# Patient Record
Sex: Female | Born: 1985
Health system: Southern US, Community
[De-identification: ages and names within clinical notes are randomized; demographics above are authoritative.]

## PROBLEM LIST (undated history)

## (undated) DIAGNOSIS — F39 Unspecified mood [affective] disorder: Secondary | ICD-10-CM

## (undated) DIAGNOSIS — M1990 Unspecified osteoarthritis, unspecified site: Secondary | ICD-10-CM

## (undated) DIAGNOSIS — K219 Gastro-esophageal reflux disease without esophagitis: Secondary | ICD-10-CM

## (undated) DIAGNOSIS — F32A Depression, unspecified: Secondary | ICD-10-CM

## (undated) DIAGNOSIS — N809 Endometriosis, unspecified: Secondary | ICD-10-CM

## (undated) DIAGNOSIS — F329 Major depressive disorder, single episode, unspecified: Secondary | ICD-10-CM

## (undated) DIAGNOSIS — T7840XA Allergy, unspecified, initial encounter: Secondary | ICD-10-CM

## (undated) DIAGNOSIS — Z8759 Personal history of other complications of pregnancy, childbirth and the puerperium: Secondary | ICD-10-CM

## (undated) DIAGNOSIS — R102 Pelvic and perineal pain: Secondary | ICD-10-CM

## (undated) DIAGNOSIS — Z8742 Personal history of other diseases of the female genital tract: Secondary | ICD-10-CM

## (undated) DIAGNOSIS — N946 Dysmenorrhea, unspecified: Secondary | ICD-10-CM

## (undated) DIAGNOSIS — Z62810 Personal history of physical and sexual abuse in childhood: Secondary | ICD-10-CM

## (undated) DIAGNOSIS — G4700 Insomnia, unspecified: Secondary | ICD-10-CM

## (undated) DIAGNOSIS — D649 Anemia, unspecified: Secondary | ICD-10-CM

## (undated) DIAGNOSIS — F419 Anxiety disorder, unspecified: Secondary | ICD-10-CM

## (undated) DIAGNOSIS — M2142 Flat foot [pes planus] (acquired), left foot: Secondary | ICD-10-CM

## (undated) DIAGNOSIS — M2141 Flat foot [pes planus] (acquired), right foot: Secondary | ICD-10-CM

## (undated) HISTORY — DX: Endometriosis, unspecified: N80.9

## (undated) HISTORY — DX: Flat foot (pes planus) (acquired), right foot: M21.41

## (undated) HISTORY — DX: Allergy, unspecified, initial encounter: T78.40XA

## (undated) HISTORY — DX: Personal history of other complications of pregnancy, childbirth and the puerperium: Z87.59

## (undated) HISTORY — DX: Dysmenorrhea, unspecified: N94.6

## (undated) HISTORY — DX: Unspecified osteoarthritis, unspecified site: M19.90

## (undated) HISTORY — PX: MOUTH SURGERY: SHX715

## (undated) HISTORY — DX: Insomnia, unspecified: G47.00

## (undated) HISTORY — DX: Personal history of other diseases of the female genital tract: Z87.42

## (undated) HISTORY — DX: Unspecified mood (affective) disorder: F39

## (undated) HISTORY — DX: Personal history of physical and sexual abuse in childhood: Z62.810

## (undated) HISTORY — DX: Flat foot (pes planus) (acquired), right foot: M21.42

## (undated) HISTORY — DX: Pelvic and perineal pain: R10.2

## (undated) HISTORY — PX: ABDOMINAL HYSTERECTOMY: SHX81

---
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
## 2006-10-14 ENCOUNTER — Emergency Department: Payer: Self-pay | Admitting: Emergency Medicine

## 2006-10-14 ENCOUNTER — Other Ambulatory Visit: Payer: Self-pay

## 2007-09-26 ENCOUNTER — Emergency Department: Payer: Self-pay | Admitting: Emergency Medicine

## 2008-12-04 ENCOUNTER — Emergency Department: Payer: Self-pay | Admitting: Emergency Medicine

## 2011-07-14 ENCOUNTER — Emergency Department: Payer: Self-pay | Admitting: Emergency Medicine

## 2011-09-24 ENCOUNTER — Emergency Department: Payer: Self-pay | Admitting: Internal Medicine

## 2011-09-24 LAB — COMPREHENSIVE METABOLIC PANEL
Albumin: 3.9 g/dL (ref 3.4–5.0)
Alkaline Phosphatase: 36 U/L — ABNORMAL LOW (ref 50–136)
BUN: 9 mg/dL (ref 7–18)
Bilirubin,Total: 0.5 mg/dL (ref 0.2–1.0)
Calcium, Total: 9.6 mg/dL (ref 8.5–10.1)
Creatinine: 0.6 mg/dL (ref 0.60–1.30)
Glucose: 88 mg/dL (ref 65–99)
Osmolality: 268 (ref 275–301)
Potassium: 4.5 mmol/L (ref 3.5–5.1)
Sodium: 135 mmol/L — ABNORMAL LOW (ref 136–145)
Total Protein: 8.1 g/dL (ref 6.4–8.2)

## 2011-09-24 LAB — URINALYSIS, COMPLETE
Bilirubin,UR: NEGATIVE
Glucose,UR: NEGATIVE mg/dL (ref 0–75)
Hyaline Cast: 2
RBC,UR: 2 /HPF (ref 0–5)
Squamous Epithelial: 2
WBC UR: 3 /HPF (ref 0–5)

## 2011-09-24 LAB — CBC
HCT: 37.3 % (ref 35.0–47.0)
HGB: 12.3 g/dL (ref 12.0–16.0)
MCV: 84 fL (ref 80–100)
Platelet: 238 10*3/uL (ref 150–440)
RBC: 4.42 10*6/uL (ref 3.80–5.20)
WBC: 6.2 10*3/uL (ref 3.6–11.0)

## 2011-09-24 LAB — HCG, QUANTITATIVE, PREGNANCY: Beta Hcg, Quant.: 86827 m[IU]/mL — ABNORMAL HIGH

## 2012-02-11 ENCOUNTER — Observation Stay: Payer: Self-pay | Admitting: Obstetrics and Gynecology

## 2012-03-22 ENCOUNTER — Inpatient Hospital Stay: Payer: Self-pay

## 2012-03-22 LAB — CBC WITH DIFFERENTIAL/PLATELET
Eosinophil #: 0.1 10*3/uL (ref 0.0–0.7)
HCT: 33.6 % — ABNORMAL LOW (ref 35.0–47.0)
Lymphocyte #: 2.6 10*3/uL (ref 1.0–3.6)
Lymphocyte %: 21.1 %
MCHC: 32.4 g/dL (ref 32.0–36.0)
MCV: 80 fL (ref 80–100)
Monocyte #: 0.9 x10 3/mm (ref 0.2–0.9)
Monocyte %: 7.1 %
Neutrophil #: 8.8 10*3/uL — ABNORMAL HIGH (ref 1.4–6.5)
Platelet: 148 10*3/uL — ABNORMAL LOW (ref 150–440)
RDW: 13.8 % (ref 11.5–14.5)
WBC: 12.5 10*3/uL — ABNORMAL HIGH (ref 3.6–11.0)

## 2012-03-23 LAB — HEMATOCRIT: HCT: 28.8 % — ABNORMAL LOW (ref 35.0–47.0)

## 2013-03-23 ENCOUNTER — Emergency Department: Payer: Self-pay | Admitting: Emergency Medicine

## 2013-03-23 LAB — URINALYSIS, COMPLETE
Bilirubin,UR: NEGATIVE
Ketone: NEGATIVE
Leukocyte Esterase: NEGATIVE
Nitrite: NEGATIVE
Ph: 6 (ref 4.5–8.0)
Protein: NEGATIVE
Specific Gravity: 1.02 (ref 1.003–1.030)
Squamous Epithelial: NONE SEEN

## 2013-03-23 LAB — HCG, QUANTITATIVE, PREGNANCY: Beta Hcg, Quant.: <1 m[IU]/mL — ABNORMAL LOW

## 2013-03-23 LAB — CBC
HCT: 38.6 % (ref 35.0–47.0)
HGB: 12.8 g/dL (ref 12.0–16.0)
Platelet: 180 10*3/uL (ref 150–440)
RDW: 14.3 % (ref 11.5–14.5)
WBC: 5.4 10*3/uL (ref 3.6–11.0)

## 2013-04-14 ENCOUNTER — Emergency Department: Payer: Self-pay | Admitting: Emergency Medicine

## 2013-04-14 LAB — BASIC METABOLIC PANEL
Anion Gap: 6 — ABNORMAL LOW (ref 7–16)
BUN: 9 mg/dL (ref 7–18)
Calcium, Total: 9.3 mg/dL (ref 8.5–10.1)
EGFR (African American): >60
EGFR (Non-African Amer.): >60
Glucose: 87 mg/dL (ref 65–99)
Osmolality: 279 (ref 275–301)

## 2013-04-14 LAB — CBC
MCH: 27.3 pg (ref 26.0–34.0)
MCHC: 33.1 g/dL (ref 32.0–36.0)
MCV: 83 fL (ref 80–100)
Platelet: 200 10*3/uL (ref 150–440)

## 2013-04-14 LAB — URINALYSIS, COMPLETE
Bilirubin,UR: NEGATIVE
Ketone: NEGATIVE
Nitrite: NEGATIVE
Ph: 7 (ref 4.5–8.0)
Protein: NEGATIVE
RBC,UR: 13 /HPF (ref 0–5)
Specific Gravity: 1.019 (ref 1.003–1.030)
Squamous Epithelial: 2
WBC UR: 2 /HPF (ref 0–5)

## 2013-04-14 LAB — PREGNANCY, URINE: Pregnancy Test, Urine: NEGATIVE m[IU]/mL

## 2013-04-15 LAB — WET PREP, GENITAL

## 2013-04-15 LAB — GC/CHLAMYDIA PROBE AMP

## 2013-09-13 ENCOUNTER — Emergency Department: Payer: Self-pay | Admitting: Emergency Medicine

## 2013-09-13 LAB — CBC WITH DIFFERENTIAL/PLATELET
Basophil #: 0 10*3/uL (ref 0.0–0.1)
Basophil %: 1 %
EOS ABS: 0.1 10*3/uL (ref 0.0–0.7)
Eosinophil %: 2.8 %
HCT: 41.8 % (ref 35.0–47.0)
HGB: 13.7 g/dL (ref 12.0–16.0)
Lymphocyte #: 1.6 10*3/uL (ref 1.0–3.6)
Lymphocyte %: 54.2 %
MCH: 27.5 pg (ref 26.0–34.0)
MCHC: 32.7 g/dL (ref 32.0–36.0)
MCV: 84 fL (ref 80–100)
MONO ABS: 0.2 x10 3/mm (ref 0.2–0.9)
Monocyte %: 7.8 %
NEUTROS ABS: 1 10*3/uL — AB (ref 1.4–6.5)
Neutrophil %: 34.2 %
PLATELETS: 174 10*3/uL (ref 150–440)
RBC: 4.97 10*6/uL (ref 3.80–5.20)
RDW: 13.5 % (ref 11.5–14.5)
WBC: 2.9 10*3/uL — ABNORMAL LOW (ref 3.6–11.0)

## 2013-09-13 LAB — URINALYSIS, COMPLETE
BILIRUBIN, UR: NEGATIVE
BLOOD: NEGATIVE
Glucose,UR: NEGATIVE mg/dL (ref 0–75)
Ketone: NEGATIVE
Leukocyte Esterase: NEGATIVE
Nitrite: NEGATIVE
Ph: 6 (ref 4.5–8.0)
Protein: NEGATIVE
SPECIFIC GRAVITY: 1.026 (ref 1.003–1.030)
Squamous Epithelial: 3

## 2013-09-13 LAB — COMPREHENSIVE METABOLIC PANEL
ALBUMIN: 3.8 g/dL (ref 3.4–5.0)
Alkaline Phosphatase: 68 U/L
Anion Gap: 7 (ref 7–16)
BUN: 8 mg/dL (ref 7–18)
Bilirubin,Total: 0.4 mg/dL (ref 0.2–1.0)
CALCIUM: 8.8 mg/dL (ref 8.5–10.1)
Chloride: 107 mmol/L (ref 98–107)
Co2: 27 mmol/L (ref 21–32)
Creatinine: 0.76 mg/dL (ref 0.60–1.30)
EGFR (Non-African Amer.): >60
Glucose: 54 mg/dL — ABNORMAL LOW (ref 65–99)
Osmolality: 277 (ref 275–301)
POTASSIUM: 3.3 mmol/L — AB (ref 3.5–5.1)
SGOT(AST): 28 U/L (ref 15–37)
SGPT (ALT): 19 U/L (ref 12–78)
Sodium: 141 mmol/L (ref 136–145)
Total Protein: 7.9 g/dL (ref 6.4–8.2)

## 2014-07-21 ENCOUNTER — Emergency Department: Payer: Self-pay | Admitting: Emergency Medicine

## 2014-07-21 LAB — URINALYSIS, COMPLETE
BILIRUBIN, UR: NEGATIVE
Glucose,UR: NEGATIVE mg/dL (ref 0–75)
Ketone: NEGATIVE
Nitrite: NEGATIVE
PH: 5 (ref 4.5–8.0)
Protein: NEGATIVE
RBC,UR: 2 /HPF (ref 0–5)
Specific Gravity: 1.026 (ref 1.003–1.030)

## 2014-07-21 LAB — BASIC METABOLIC PANEL
ANION GAP: 7 (ref 7–16)
BUN: 7 mg/dL (ref 7–18)
CREATININE: 0.64 mg/dL (ref 0.60–1.30)
Calcium, Total: 8.8 mg/dL (ref 8.5–10.1)
Chloride: 107 mmol/L (ref 98–107)
Co2: 27 mmol/L (ref 21–32)
EGFR (African American): >60
EGFR (Non-African Amer.): >60
Glucose: 82 mg/dL (ref 65–99)
Osmolality: 278 (ref 275–301)
Potassium: 3.6 mmol/L (ref 3.5–5.1)
SODIUM: 141 mmol/L (ref 136–145)

## 2014-07-21 LAB — CBC
HCT: 37.9 % (ref 35.0–47.0)
HGB: 12 g/dL (ref 12.0–16.0)
MCH: 27.4 pg (ref 26.0–34.0)
MCHC: 31.8 g/dL — ABNORMAL LOW (ref 32.0–36.0)
MCV: 86 fL (ref 80–100)
Platelet: 203 10*3/uL (ref 150–440)
RBC: 4.4 10*6/uL (ref 3.80–5.20)
RDW: 12.9 % (ref 11.5–14.5)
WBC: 4.7 10*3/uL (ref 3.6–11.0)

## 2014-07-21 LAB — WET PREP, GENITAL

## 2014-07-21 LAB — GC/CHLAMYDIA PROBE AMP

## 2014-07-21 LAB — HCG, QUANTITATIVE, PREGNANCY

## 2014-07-21 LAB — D-DIMER(ARMC): D-Dimer: 151 ng/ml

## 2014-12-19 NOTE — H&P (Signed)
L&D Evaluation:  History Expanded:   HPI 29 yo 1 whose EDC = 04/09/12, patient [facility].  Pt has SROM at 6 am    Blood Type (Maternal) B positive    Group B Strep Results Maternal (Result >5wks must be treated as unknown) negative    Maternal HIV Negative    Maternal Syphilis Ab Nonreactive    Maternal Varicella Immune    Rubella Results (Maternal) immune    [facility]    Presents with leaking fluid    Patient's Medical History No Chronic Illness    Patient's Surgical History none    Medications Pre Natal Vitamins    Allergies NKDA    Social History none   ROS:   ROS All systems were reviewed.  HEENT, CNS, GI, GU, Respiratory, CV, Renal and Musculoskeletal systems were found to be normal.   Exam:   General no apparent distress    Mental Status clear    Chest clear    Heart normal sinus rhythm    Abdomen gravid, non-tender    Estimated Fetal Weight Average for gestational age    Back no CVAT    Pelvic 1-2, obvious SROM    Mebranes Ruptured    Description clear    FHT normal rate with no decels    Ucx irregular   Impression:   Impression early labor   Plan:   Comments Pt has been fully informed of the pros and cons, risk/benefits continued close observation versus the risks of induction. She understands that there are uncommon risks to induction, which include but are not limited to:  frequent and/or prolonged uterine contractions, fetal distress, uterine rupture and lack of success of induction.  She also has been informed that if the induction is not successful a Cesarean Section may be necessary.  All questions have been answered and she is in agreement with induction.   Electronic Signatures: Towana Badgerosenow, Pinkney Venard J (MD)  (Signed 12-Aug-13 10:04)  Authored: L&D Evaluation   Last Updated: 12-Aug-13 10:04 by Towana Badgerosenow, Bradley Handyside J (MD)

## 2015-01-17 ENCOUNTER — Ambulatory Visit
Admission: EM | Admit: 2015-01-17 | Discharge: 2015-01-17 | Disposition: A | Discharge status: Home / Self Care | Payer: 59 | Attending: Physician Assistant | Admitting: Physician Assistant

## 2015-01-17 DIAGNOSIS — J309 Allergic rhinitis, unspecified: Secondary | ICD-10-CM

## 2015-01-17 HISTORY — DX: Depression, unspecified: F32.A

## 2015-01-17 HISTORY — DX: Major depressive disorder, single episode, unspecified: F32.9

## 2015-01-17 HISTORY — DX: Anxiety disorder, unspecified: F41.9

## 2015-01-17 MED ORDER — FEXOFENADINE-PSEUDOEPHED ER 60-120 MG PO TB12: 1.0000 | ORAL_TABLET | Freq: Two times a day (BID) | ORAL | Status: DC

## 2015-01-17 MED ORDER — FLUTICASONE PROPIONATE 50 MCG/ACT NA SUSP: 1.0000 | Freq: Every day | NASAL | Status: DC

## 2015-01-17 NOTE — ED Provider Notes (Signed)
CSN: [id number]     Arrival date & time 01/17/15  1830 History   None    Chief Complaint  Patient presents with  . Allergic Rhinitis    (Consider location/radiation/quality/duration/timing/severity/associated sxs/prior Treatment) HPI  Patient presents with one week history of post-nasal drip, headache, and scab formation in the nasal mucous membranes. She denies any fever, chills, cough, or SOB. No sinus pressure or pain. She has tried taking OTC Mucinex and Sudafed with minimal relief. She also attempted to use a EchoStareti Pot, but was unable to tolerate it. She denies any history of seasonal allergies, but states she sneezes year-round and does not take anything for this.  Past Medical History  Diagnosis Date  . Depression   . Anxiety    History reviewed. No pertinent past surgical history. History reviewed. No pertinent family history. History  Substance Use Topics  . Smoking status: Never Smoker   . Smokeless tobacco: Not on file  . Alcohol Use: No   OB History    No data available     Review of Systems  Constitutional: Negative for fever and chills.  HENT: Positive for nosebleeds, postnasal drip and trouble swallowing. Negative for congestion, ear discharge, ear pain, sinus pressure and sore throat.   Respiratory: Negative for cough and shortness of breath.   Cardiovascular: Negative for chest pain.  Gastrointestinal: Negative for nausea and diarrhea.  Neurological: Positive for headaches.  Psychiatric/Behavioral: The patient is not nervous/anxious.     Allergies  Review of patient's allergies indicates no known allergies.  Home Medications   Prior to Admission medications   Not on File   LMP 01/11/2015 (Exact Date) Physical Exam  Constitutional: She is oriented to person, place, and time. She appears well-developed and well-nourished.  HENT:  Head: Normocephalic.  Right Ear: External ear normal.  Left Ear: External ear normal.  Nose: Nose normal.  Mouth/Throat:  Oropharynx is clear and moist.  Cardiovascular: Normal rate, regular rhythm and normal heart sounds.   No murmur heard. Pulmonary/Chest: Breath sounds normal. No respiratory distress.  Lymphadenopathy:    She has cervical adenopathy.  Neurological: She is alert and oriented to person, place, and time.  Skin: Skin is warm and dry.  Psychiatric: She has a normal mood and affect.    ED Course  Procedures (including critical care time) Labs Review Labs Reviewed - No data to display  Imaging Review No results found.   MDM   1. Allergic rhinitis, unspecified allergic rhinitis type    Patient placed on flonase and Allegra-D. If symptoms fail to improve, she will follow-up with Dr. Elenore RotaJuengel for further evaluation.    Carlean PurlNicole L Lane, PA-C 01/17/15 2018

## 2015-01-17 NOTE — Discharge Instructions (Signed)
Follow- up with Dr. Vernie MurdersPaul Juengel, ENT Specialist, if no improvement. [address] [place], [place] [id number] Location Phone: [phone]

## 2015-01-17 NOTE — ED Notes (Signed)
Pt states " I have sores in both nostrils and feel choked up in my throat. I am eating and drinking ok. When I lay down I have a constant post nasal drip. I usually have nose bleeds once a month, but now I am having them more frequently."

## 2015-05-15 ENCOUNTER — Ambulatory Visit: Payer: Self-pay | Admitting: Family Medicine

## 2015-05-24 ENCOUNTER — Encounter: Payer: Self-pay | Admitting: Family Medicine

## 2015-05-29 ENCOUNTER — Ambulatory Visit: Payer: Self-pay | Admitting: Family Medicine

## 2015-08-10 ENCOUNTER — Encounter: Payer: Self-pay | Admitting: Unknown Physician Specialty

## 2015-08-10 ENCOUNTER — Ambulatory Visit (INDEPENDENT_AMBULATORY_CARE_PROVIDER_SITE_OTHER): Payer: 59 | Admitting: Unknown Physician Specialty

## 2015-08-10 VITALS — BP 110/77 | HR 73 | Temp 98.2°F | Ht 68.0 in | Wt 120.6 lb

## 2015-08-10 DIAGNOSIS — J309 Allergic rhinitis, unspecified: Secondary | ICD-10-CM | POA: Diagnosis not present

## 2015-08-10 DIAGNOSIS — J0101 Acute recurrent maxillary sinusitis: Secondary | ICD-10-CM | POA: Diagnosis not present

## 2015-08-10 MED ORDER — AZITHROMYCIN 250 MG PO TABS: ORAL_TABLET | ORAL | Status: DC

## 2015-08-10 MED ORDER — MOMETASONE FUROATE 50 MCG/ACT NA SUSP: 2.0000 | Freq: Every day | NASAL | Status: DC

## 2015-08-10 NOTE — Progress Notes (Signed)
   BP 110/77 mmHg  Pulse 73  Temp(Src) 98.2 F (36.8 C)  Ht 5\' 8"  (1.727 m)  Wt 120 lb 9.6 oz (54.704 kg)  BMI 18.34 kg/m2  SpO2 95%  LMP 08/03/2015 (Exact Date)   Subjective:    Patient ID: Courtney Rivers, female    DOB: 04/26/1986, 29 y.o.   MRN: [id number]  HPI: Donnamarie RossettiChristan A Clinton SawyerWilliamson is a 29 y.o. female  Chief Complaint  Patient presents with  . Sinusitis    pt states she has had a sinus infection for around a month now   Sinusitis This is a new problem. The current episode started more than 1 month ago. The problem is unchanged. There has been no fever. Associated symptoms include congestion, ear pain, sinus pressure and a sore throat. Pertinent negatives include no coughing or diaphoresis. Past treatments include oral decongestants. The treatment provided no relief.   Allergic rhinitis Year round issue.  Takes the occasional Clar[place] which doesn't help.  Under the care of an allergist at one time.  She has had symptoms for years.  Flonase causes nose bleeds and all the hair to fall out in her nose.    Relevant past medical, surgical, family and social history reviewed and updated as indicated. Interim medical history since our last visit reviewed. Allergies and medications reviewed and updated.  Review of Systems  Constitutional: Negative for diaphoresis.  HENT: Positive for congestion, ear pain, sinus pressure and sore throat.   Respiratory: Negative for cough.     Per HPI unless specifically indicated above     Objective:    BP 110/77 mmHg  Pulse 73  Temp(Src) 98.2 F (36.8 C)  Ht 5\' 8"  (1.727 m)  Wt 120 lb 9.6 oz (54.704 kg)  BMI 18.34 kg/m2  SpO2 95%  LMP 08/03/2015 (Exact Date)  Wt Readings from Last 3 Encounters:  08/10/15 120 lb 9.6 oz (54.704 kg)    Physical Exam  Constitutional: She is oriented to person, place, and time. She appears well-developed and well-nourished. No distress.  HENT:  Head: Normocephalic and atraumatic.  Right Ear:  Tympanic membrane and ear canal normal.  Left Ear: Tympanic membrane and ear canal normal.  Nose: No rhinorrhea. Right sinus exhibits maxillary sinus tenderness. Right sinus exhibits no frontal sinus tenderness. Left sinus exhibits maxillary sinus tenderness. Left sinus exhibits no frontal sinus tenderness.  Eyes: Conjunctivae and lids are normal. Right eye exhibits no discharge. Left eye exhibits no discharge. No scleral icterus.  Cardiovascular: Normal rate and regular rhythm.   Pulmonary/Chest: Effort normal and breath sounds normal. No respiratory distress.  Abdominal: Normal appearance. There is no splenomegaly or hepatomegaly.  Musculoskeletal: Normal range of motion.  Neurological: She is alert and oriented to person, place, and time.  Skin: Skin is intact. No rash noted. No pallor.  Psychiatric: She has a normal mood and affect. Her behavior is normal. Judgment and thought content normal.     Assessment & Plan:   Problem List Items Addressed This Visit      Unprioritized   Rhinitis, allergic - Primary    Other Visit Diagnoses    Acute recurrent maxillary sinusitis        Relevant Medications    azithromycin (ZITHROMAX Z-PAK) 250 MG tablet    mometasone (NASONEX) 50 MCG/ACT nasal spray        Follow up plan: Return if symptoms worsen or fail to improve.

## 2015-09-11 ENCOUNTER — Encounter: Payer: Self-pay | Admitting: Family Medicine

## 2015-11-02 ENCOUNTER — Ambulatory Visit: Payer: 59 | Admitting: Family Medicine

## 2015-12-18 ENCOUNTER — Encounter: Payer: Self-pay | Admitting: Family Medicine

## 2015-12-18 ENCOUNTER — Ambulatory Visit (INDEPENDENT_AMBULATORY_CARE_PROVIDER_SITE_OTHER): Payer: 59 | Admitting: Family Medicine

## 2015-12-18 VITALS — BP 105/70 | HR 83 | Temp 99.0°F | Ht 67.7 in | Wt 121.0 lb

## 2015-12-18 DIAGNOSIS — J019 Acute sinusitis, unspecified: Secondary | ICD-10-CM

## 2015-12-18 MED ORDER — AZITHROMYCIN 250 MG PO TABS: ORAL_TABLET | ORAL | Status: DC

## 2015-12-18 NOTE — Progress Notes (Signed)
BP 105/70 mmHg  Pulse 83  Temp(Src) 99 F (37.2 C)  Ht 5' 7.7" (1.72 m)  Wt 121 lb (54.885 kg)  BMI 18.55 kg/m2  SpO2 100%  LMP 11/21/2015 (Exact Date)   Subjective:    Patient ID: Courtney Rivers, female    DOB: 10/13/1985, 30 y.o.   MRN: [id number]  HPI: Courtney Rivers is a 30 y.o. female  Chief Complaint  Patient presents with  . URI    started Sunday    Patient with marked sinus congestion and facial pain and pressure feeling bad has really been ongoing for 4 days now and getting worse. Has had marked systemic symptoms with chills aches feeling bad and tired. Has tried Mucinex  Relevant past medical, surgical, family and social history reviewed and updated as indicated. Interim medical history since our last visit reviewed. Allergies and medications reviewed and updated.  Review of Systems  Constitutional: Positive for fever, chills, diaphoresis and fatigue.  HENT: Positive for congestion, sinus pressure, sneezing and sore throat.   Respiratory: Positive for cough.   Cardiovascular: Negative.     Per HPI unless specifically indicated above     Objective:    BP 105/70 mmHg  Pulse 83  Temp(Src) 99 F (37.2 C)  Ht 5' 7.7" (1.72 m)  Wt 121 lb (54.885 kg)  BMI 18.55 kg/m2  SpO2 100%  LMP 11/21/2015 (Exact Date)  Wt Readings from Last 3 Encounters:  12/18/15 121 lb (54.885 kg)  08/10/15 120 lb 9.6 oz (54.704 kg)    Physical Exam  Constitutional: She is oriented to person, place, and time. She appears well-developed and well-nourished. No distress.  HENT:  Head: Normocephalic and atraumatic.  Right Ear: Hearing and external ear normal.  Left Ear: Hearing and external ear normal.  Nose: Nose normal.  Mouth/Throat: Oropharyngeal exudate present.  Eyes: Conjunctivae and lids are normal. Right eye exhibits no discharge. Left eye exhibits no discharge. No scleral icterus.  Cardiovascular: Normal rate, regular rhythm and normal heart sounds.    Pulmonary/Chest: Effort normal and breath sounds normal. No respiratory distress.  Musculoskeletal: Normal range of motion.  Lymphadenopathy:    She has no cervical adenopathy.  Neurological: She is alert and oriented to person, place, and time.  Skin: Skin is intact. No rash noted.  Psychiatric: She has a normal mood and affect. Her speech is normal and behavior is normal. Judgment and thought content normal. Cognition and memory are normal.    Results for orders placed or performed in visit on 07/21/14  GC/Chlamydia Probe Amp  Result Value Ref Range   Micro Text Report         SOURCE: ENDOCERVICAL SWAB    CHLAMYDIA                 CHLAMYDIA TRACHOMATIS NEGATIVE   N.GONORRHOEAE             N.GONORRHOEAE NEGATIVE   ANTIBIOTIC                                                      Wet prep, genital  Result Value Ref Range   Micro Text Report         COMMENT                   MODERATE WHITE BLOOD  CELLS SEEN   COMMENT                   NO TRICHOMONAS,SPERMATOZOA,YEAST,OR CLUE CELLS SEEN   ANTIBIOTIC                                                      CBC  Result Value Ref Range   WBC 4.7 3.6-11.0 x10 3/mm 3   RBC 4.40 3.80-5.20 X10 6/mm 3   HGB 12.0 12.0-16.0 g/dL   HCT 37.9 35.0-47.0 %   MCV 86 80-100 fL   MCH 27.4 26.0-34.0 pg   MCHC 31.8 (L) 32.0-36.0 g/dL   RDW 12.9 11.5-14.5 %   Platelet 203 150-440 x10 3/mm 3  Basic metabolic panel  Result Value Ref Range   Glucose 82 65-99 mg/dL   BUN 7 7-18 mg/dL   Creatinine 0.64 0.60-1.30 mg/dL   Sodium 141 136-145 mmol/L   Potassium 3.6 3.5-5.1 mmol/L   Chloride 107 98-107 mmol/L   Co2 27 21-32 mmol/L   Calcium, Total 8.8 8.5-10.1 mg/dL   Osmolality 278 275-301   Anion Gap 7 7-16   EGFR (African American) >60 >43m/min   EGFR (Non-African Amer.) >60 >614mmin  Urinalysis, Complete  Result Value Ref Range   Color - urine Yellow    Clarity - urine Hazy    Glucose,UR Negative 0-75 mg/dL   Bilirubin,UR Negative  NEGATIVE   Ketone Negative NEGATIVE   Specific Gravity 1.026 1.003-1.030   Blood 1+ NEGATIVE   Ph 5.0 4.5-8.0   Protein Negative NEGATIVE   Nitrite Negative NEGATIVE   Leukocyte Esterase Trace NEGATIVE   RBC,UR 2 /HPF 0-5 /HPF   WBC UR 7 /HPF 0-5 /HPF   Bacteria TRACE NONE SEEN   Squamous Epithelial 7 /HPF    Mucous PRESENT   [facility] Result Value Ref Range   D-Dimer 151 ng/ml  hCG, quantitative, pregnancy  Result Value Ref Range   Beta Hcg, Quant. < 1 (L) mIU/mL      Assessment & Plan:   Problem List Items Addressed This Visit    None    Visit Diagnoses    Acute sinusitis, recurrence not specified, unspecified location    -  Primary    Discussed sinusitis care and treatment use of over-the-counter medications Mucinex Tylenol etc. will go home for the next day or 2 proper cautions    Relevant Medications    azithromycin (ZITHROMAX) 250 MG tablet        Follow up plan: Return if symptoms worsen or fail to improve, for As scheduled.

## 2016-08-16 ENCOUNTER — Telehealth: Payer: 59 | Admitting: Nurse Practitioner

## 2016-08-16 DIAGNOSIS — J111 Influenza due to unidentified influenza virus with other respiratory manifestations: Secondary | ICD-10-CM

## 2016-08-16 MED ORDER — OSELTAMIVIR PHOSPHATE 75 MG PO CAPS: 75.0000 mg | ORAL_CAPSULE | Freq: Two times a day (BID) | ORAL | 0 refills | Status: DC

## 2016-08-16 NOTE — Progress Notes (Signed)

## 2016-08-17 ENCOUNTER — Encounter: Payer: Self-pay | Admitting: *Deleted

## 2016-08-17 ENCOUNTER — Ambulatory Visit
Admission: EM | Admit: 2016-08-17 | Discharge: 2016-08-17 | Disposition: A | Discharge status: Home / Self Care | Payer: 59 | Attending: Emergency Medicine | Admitting: Emergency Medicine

## 2016-08-17 DIAGNOSIS — J101 Influenza due to other identified influenza virus with other respiratory manifestations: Secondary | ICD-10-CM | POA: Diagnosis not present

## 2016-08-17 LAB — RAPID INFLUENZA A&B ANTIGENS
Influenza A (ARMC): POSITIVE — AB
Influenza B (ARMC): NEGATIVE

## 2016-08-17 LAB — RAPID STREP SCREEN (MED CTR MEBANE ONLY): Streptococcus, Group A Screen (Direct): NEGATIVE

## 2016-08-17 MED ORDER — ONDANSETRON 4 MG PO TBDP: 4.0000 mg | ORAL_TABLET | Freq: Three times a day (TID) | ORAL | 0 refills | Status: DC | PRN

## 2016-08-17 MED ORDER — IPRATROPIUM BROMIDE 0.06 % NA SOLN: 2.0000 | Freq: Four times a day (QID) | NASAL | 0 refills | Status: DC

## 2016-08-17 MED ORDER — IBUPROFEN 600 MG PO TABS: 600.0000 mg | ORAL_TABLET | Freq: Four times a day (QID) | ORAL | 0 refills | Status: DC | PRN

## 2016-08-17 NOTE — ED Triage Notes (Signed)
Patient started having symptoms of cough, fever, body aches, sore throat, nasal congestion and drainage yesterday. Symptoms have worsened over time.

## 2016-08-17 NOTE — Discharge Instructions (Signed)
600 mg ibuprofen with 1 g of Tylenol, Zofran, push electrolyte containing fluids, try the Atrovent and the saline nasal irrigation.
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
## 2016-08-20 LAB — CULTURE, GROUP A STREP (THRC)

## 2016-10-31 ENCOUNTER — Ambulatory Visit (INDEPENDENT_AMBULATORY_CARE_PROVIDER_SITE_OTHER): Payer: 59 | Admitting: Family Medicine

## 2016-10-31 ENCOUNTER — Encounter: Payer: Self-pay | Admitting: Family Medicine

## 2016-10-31 VITALS — BP 112/79 | HR 86 | Temp 98.4°F | Ht 69.0 in | Wt 120.0 lb

## 2016-10-31 DIAGNOSIS — F331 Major depressive disorder, recurrent, moderate: Secondary | ICD-10-CM | POA: Diagnosis not present

## 2016-10-31 DIAGNOSIS — Z309 Encounter for contraceptive management, unspecified: Secondary | ICD-10-CM

## 2016-10-31 MED ORDER — BUPROPION HCL ER (XL) 150 MG PO TB24: 150.0000 mg | ORAL_TABLET | Freq: Every day | ORAL | 1 refills | Status: DC
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
## 2016-11-03 DIAGNOSIS — F329 Major depressive disorder, single episode, unspecified: Secondary | ICD-10-CM | POA: Insufficient documentation

## 2016-11-03 DIAGNOSIS — F331 Major depressive disorder, recurrent, moderate: Secondary | ICD-10-CM | POA: Insufficient documentation

## 2016-11-03 NOTE — Assessment & Plan Note (Signed)
Will start back on wellbutrin and monitor very closely. Will add further medications slowly. Discussed working with a counselor as adjunct therapy, did not like her previous experience so will provide list of other options. Risks and benefits discussed, alerted her about suicidality warnings as well as possibility of increased agitation or anxiety.

## 2016-11-03 NOTE — Patient Instructions (Signed)
Follow up in 1 month for physical and medication management

## 2016-11-06 ENCOUNTER — Other Ambulatory Visit: Payer: Self-pay

## 2016-11-06 MED ORDER — ONDANSETRON 4 MG PO TBDP: 4.0000 mg | ORAL_TABLET | Freq: Three times a day (TID) | ORAL | 0 refills | Status: DC | PRN

## 2016-11-06 NOTE — Telephone Encounter (Signed)
Zofran sent. 

## 2016-11-06 NOTE — Telephone Encounter (Signed)
She is having some nausea with her new medication and wants to know if you can refill her zofran rx.

## 2016-11-18 ENCOUNTER — Telehealth: Payer: Self-pay

## 2016-11-18 MED ORDER — QUETIAPINE FUMARATE 25 MG PO TABS: 25.0000 mg | ORAL_TABLET | Freq: Every day | ORAL | 1 refills | Status: DC

## 2016-11-18 MED ORDER — SERTRALINE HCL 50 MG PO TABS: 50.0000 mg | ORAL_TABLET | Freq: Every day | ORAL | 1 refills | Status: DC

## 2016-11-18 MED ORDER — NORGESTIM-ETH ESTRAD TRIPHASIC 0.18/0.215/0.25 MG-35 MCG PO TABS: 1.0000 | ORAL_TABLET | Freq: Every day | ORAL | 11 refills | Status: DC

## 2016-11-18 NOTE — Telephone Encounter (Signed)
Patient has some concerns about her Wellbutrin and if it is working for her. She also wants to know if the Wellbutrin is for anxiety as well.   She wants to start a BCP.  She also wants to know if there is something she can take to help her sleep.

## 2016-11-18 NOTE — Telephone Encounter (Signed)
Wellbutrin not having any effect, pt wanting to switch. Was previously on seroquel and she believes zoloft and did well, wanting to try that again. Discussed with patient taper off wellbutrin take every other day x 1 week then start zoloft every morning and seroquel at night.  Will start ortho tri cyclen until she can get into GYN next month to discuss implantable BC methods to try and help regulate cycles/sxs better.

## 2016-11-19 ENCOUNTER — Other Ambulatory Visit: Payer: Self-pay | Admitting: Family Medicine

## 2016-11-19 MED ORDER — QUETIAPINE FUMARATE 25 MG PO TABS: 25.0000 mg | ORAL_TABLET | Freq: Every day | ORAL | 1 refills | Status: DC

## 2016-11-19 MED ORDER — SERTRALINE HCL 50 MG PO TABS: 50.0000 mg | ORAL_TABLET | Freq: Every day | ORAL | 1 refills | Status: DC

## 2016-11-20 ENCOUNTER — Other Ambulatory Visit: Payer: Self-pay | Admitting: Family Medicine

## 2016-11-20 MED ORDER — NORGESTIM-ETH ESTRAD TRIPHASIC 0.18/0.215/0.25 MG-35 MCG PO TABS: 1.0000 | ORAL_TABLET | Freq: Every day | ORAL | 11 refills | Status: DC

## 2016-11-26 ENCOUNTER — Telehealth: Payer: Self-pay

## 2016-11-26 NOTE — Telephone Encounter (Signed)
Error

## 2016-11-26 NOTE — Telephone Encounter (Deleted)
She is still struggling with her depression, PHQ 9 score has upped from 15 to 24.

## 2016-12-03 ENCOUNTER — Encounter: Payer: Self-pay | Admitting: Family Medicine

## 2016-12-03 ENCOUNTER — Ambulatory Visit (INDEPENDENT_AMBULATORY_CARE_PROVIDER_SITE_OTHER): Payer: 59 | Admitting: Family Medicine

## 2016-12-03 VITALS — BP 101/68 | HR 72 | Temp 98.5°F | Wt 118.0 lb

## 2016-12-03 DIAGNOSIS — J029 Acute pharyngitis, unspecified: Secondary | ICD-10-CM

## 2016-12-03 DIAGNOSIS — J069 Acute upper respiratory infection, unspecified: Secondary | ICD-10-CM

## 2016-12-03 DIAGNOSIS — R6889 Other general symptoms and signs: Secondary | ICD-10-CM | POA: Diagnosis not present

## 2016-12-03 LAB — VERITOR FLU A/B WAIVED
INFLUENZA B: NEGATIVE
Influenza A: NEGATIVE

## 2016-12-03 MED ORDER — MECLIZINE HCL 25 MG PO TABS: 25.0000 mg | ORAL_TABLET | Freq: Three times a day (TID) | ORAL | 0 refills | Status: DC | PRN

## 2016-12-03 MED ORDER — LIDOCAINE VISCOUS 2 % MT SOLN: 5.0000 mL | OROMUCOSAL | 0 refills | Status: DC | PRN

## 2016-12-03 MED ORDER — PSEUDOEPHEDRINE HCL 30 MG PO TABS: 30.0000 mg | ORAL_TABLET | ORAL | 0 refills | Status: DC | PRN

## 2016-12-03 NOTE — Progress Notes (Signed)
   BP 101/68 (BP Location: Left Arm, Patient Position: Sitting, Cuff Size: Normal)   Pulse 72   Temp 98.5 F (36.9 C)   Wt 118 lb (53.5 kg)   SpO2 100%   BMI 17.43 kg/m    Subjective:    Patient ID: Courtney Rivers, female    DOB: 10/15/1985, 31 y.o.   MRN: [id number]  HPI: Courtney Rivers is a 31 y.o. female  Chief Complaint  Patient presents with  . URI   Patient presents with 4-5 day history of congestion, sore throat, dizziness, ear pain, fatigue. The dizziness is nearly constant and feels like the room is spinning around her. Denies fever, chills, aches, N/V/D, cough, SOB. Has not tried anything for sxs other than regular allergy regimen of zyrtec tablets. No sick contacts at home, but was around 300 college students this weekend.   Relevant past medical, surgical, family and social history reviewed and updated as indicated. Interim medical history since our last visit reviewed. Allergies and medications reviewed and updated.  Review of Systems  Constitutional: Positive for appetite change and fatigue.  HENT: Positive for congestion, ear pain and sore throat. Negative for sinus pain.   Eyes: Negative.   Respiratory: Negative.   Cardiovascular: Negative.   Gastrointestinal: Negative.   Genitourinary: Negative.   Musculoskeletal: Negative.   Neurological: Negative.   Psychiatric/Behavioral: Negative.     Per HPI unless specifically indicated above     Objective:    BP 101/68 (BP Location: Left Arm, Patient Position: Sitting, Cuff Size: Normal)   Pulse 72   Temp 98.5 F (36.9 C)   Wt 118 lb (53.5 kg)   SpO2 100%   BMI 17.43 kg/m   Wt Readings from Last 3 Encounters:  12/03/16 118 lb (53.5 kg)  10/31/16 120 lb (54.4 kg)  08/17/16 120 lb (54.4 kg)    Physical Exam  Constitutional: She is oriented to person, place, and time. She appears well-developed and well-nourished.  HENT:  Head: Atraumatic.  Nose: Nose normal.  B/l TMs with significant  amount of cerumen present, difficult to visualize TMs Oropharynx non-erythematous but tonsils appear mildly cryptic  Eyes: Conjunctivae are normal. Pupils are equal, round, and reactive to light.  Neck: Normal range of motion. Neck supple.  Cardiovascular: Normal rate and normal heart sounds.   Pulmonary/Chest: Effort normal and breath sounds normal. No respiratory distress.  Musculoskeletal: Normal range of motion.  Neurological: She is alert and oriented to person, place, and time.  Skin: Skin is warm and dry.  Psychiatric: She has a normal mood and affect. Her behavior is normal.  Nursing note and vitals reviewed.     Assessment & Plan:   Problem List Items Addressed This Visit    None    Visit Diagnoses    Viral upper respiratory tract infection    -  Primary   Rapid strep neg, suspect viral illness. Will send meclizine for dizziness and viscous lidocaine for throat relief. Sudafed, zyrtec, humidifier   Relevant Orders   Influenza A & B (STAT) (Completed)   Sore throat       Await strep culture. Viscous lidocaine sent. F/u if worsening or no improvement   Relevant Orders   Rapid strep screen (not at [facility]) (Completed)       Follow up plan: Return if symptoms worsen or fail to improve.

## 2016-12-04 NOTE — Patient Instructions (Signed)
Follow up as needed

## 2016-12-05 ENCOUNTER — Other Ambulatory Visit: Payer: Self-pay | Admitting: Family Medicine

## 2016-12-05 ENCOUNTER — Telehealth: Payer: Self-pay

## 2016-12-05 ENCOUNTER — Encounter: Payer: Self-pay | Admitting: Family Medicine

## 2016-12-05 MED ORDER — DOXYCYCLINE HYCLATE 100 MG PO TABS: 100.0000 mg | ORAL_TABLET | Freq: Two times a day (BID) | ORAL | 0 refills | Status: DC

## 2016-12-05 MED ORDER — AZITHROMYCIN 250 MG PO TABS: ORAL_TABLET | ORAL | 0 refills | Status: DC

## 2016-12-05 NOTE — Telephone Encounter (Signed)
Pharmacy states there is a drug interaction between Seroquel and Azithromycin. Please advise.

## 2016-12-05 NOTE — Telephone Encounter (Signed)
Sent in doxycycline

## 2016-12-06 LAB — CULTURE, GROUP A STREP: Strep A Culture: NEGATIVE

## 2016-12-06 LAB — RAPID STREP SCREEN (MED CTR MEBANE ONLY): STREP GP A AG, IA W/REFLEX: NEGATIVE

## 2016-12-17 ENCOUNTER — Telehealth: Payer: Self-pay

## 2016-12-17 NOTE — Telephone Encounter (Signed)
She wants to know if there is something else she can take for her cramps? She is trying Tylenol arthritis, Ibuprofen 800mg , flexeril, aleve.

## 2016-12-18 NOTE — Telephone Encounter (Signed)
Discussed with patient to try taking the tylenol and ibuprofen either together or an hour or two apart to layer them. Continue heating pad and other home remedies. Has GYN consult scheduled for next week for further eval

## 2016-12-25 ENCOUNTER — Ambulatory Visit: Payer: Self-pay | Admitting: Certified Nurse Midwife

## 2016-12-25 ENCOUNTER — Encounter: Payer: Self-pay | Admitting: Certified Nurse Midwife

## 2016-12-25 ENCOUNTER — Ambulatory Visit (INDEPENDENT_AMBULATORY_CARE_PROVIDER_SITE_OTHER): Payer: 59 | Admitting: Certified Nurse Midwife

## 2016-12-25 VITALS — BP 102/62 | HR 83 | Ht 67.0 in | Wt 119.0 lb

## 2016-12-25 DIAGNOSIS — N946 Dysmenorrhea, unspecified: Secondary | ICD-10-CM | POA: Diagnosis not present

## 2016-12-25 DIAGNOSIS — Z124 Encounter for screening for malignant neoplasm of cervix: Secondary | ICD-10-CM | POA: Diagnosis not present

## 2016-12-25 DIAGNOSIS — R87810 Cervical high risk human papillomavirus (HPV) DNA test positive: Secondary | ICD-10-CM

## 2016-12-25 DIAGNOSIS — R4586 Emotional lability: Secondary | ICD-10-CM

## 2016-12-25 DIAGNOSIS — F39 Unspecified mood [affective] disorder: Secondary | ICD-10-CM

## 2016-12-25 DIAGNOSIS — O926 Galactorrhea: Secondary | ICD-10-CM

## 2016-12-25 DIAGNOSIS — N643 Galactorrhea not associated with childbirth: Secondary | ICD-10-CM

## 2016-12-25 DIAGNOSIS — Z87898 Personal history of other specified conditions: Secondary | ICD-10-CM

## 2016-12-25 DIAGNOSIS — N921 Excessive and frequent menstruation with irregular cycle: Secondary | ICD-10-CM | POA: Diagnosis not present

## 2016-12-25 DIAGNOSIS — Z01419 Encounter for gynecological examination (general) (routine) without abnormal findings: Secondary | ICD-10-CM | POA: Diagnosis not present

## 2016-12-25 DIAGNOSIS — Z13220 Encounter for screening for lipoid disorders: Secondary | ICD-10-CM

## 2016-12-25 DIAGNOSIS — Z8742 Personal history of other diseases of the female genital tract: Secondary | ICD-10-CM

## 2016-12-25 DIAGNOSIS — R87611 Atypical squamous cells cannot exclude high grade squamous intraepithelial lesion on cytologic smear of cervix (ASC-H): Secondary | ICD-10-CM | POA: Diagnosis not present

## 2016-12-25 MED ORDER — LEVONORGEST-ETH ESTRAD 91-DAY 0.15-0.03 &0.01 MG PO TABS: 1.0000 | ORAL_TABLET | Freq: Every day | ORAL | 4 refills | Status: DC

## 2016-12-25 MED ORDER — NAPROXEN SODIUM 550 MG PO TABS: 550.0000 mg | ORAL_TABLET | Freq: Two times a day (BID) | ORAL | 1 refills | Status: DC
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
## 2016-12-26 LAB — LIPID PANEL WITH LDL/HDL RATIO
CHOLESTEROL TOTAL: 169 mg/dL (ref 100–199)
HDL: 71 mg/dL (ref >39)
LDL Calculated: 80 mg/dL (ref 0–99)
LDl/HDL Ratio: 1.1 ratio (ref 0.0–3.2)
TRIGLYCERIDES: 92 mg/dL (ref 0–149)
VLDL Cholesterol Cal: 18 mg/dL (ref 5–40)

## 2016-12-26 LAB — BASIC METABOLIC PANEL
BUN/Creatinine Ratio: 9 (ref 9–23)
BUN: 7 mg/dL (ref 6–20)
CALCIUM: 9.2 mg/dL (ref 8.7–10.2)
CO2: 23 mmol/L (ref 18–29)
CREATININE: 0.74 mg/dL (ref 0.57–1.00)
Chloride: 104 mmol/L (ref 96–106)
GFR calc Af Amer: 127 mL/min/{1.73_m2} (ref >59)
GFR calc non Af Amer: 110 mL/min/{1.73_m2} (ref >59)
Glucose: 67 mg/dL (ref 65–99)
Potassium: 3.7 mmol/L (ref 3.5–5.2)
SODIUM: 141 mmol/L (ref 134–144)

## 2016-12-26 LAB — CBC WITH DIFFERENTIAL/PLATELET
BASOS ABS: 0 10*3/uL (ref 0.0–0.2)
Basos: 1 %
EOS (ABSOLUTE): 0.1 10*3/uL (ref 0.0–0.4)
Eos: 4 %
Hematocrit: 34.9 % (ref 34.0–46.6)
Hemoglobin: 11.2 g/dL (ref 11.1–15.9)
IMMATURE GRANS (ABS): 0 10*3/uL (ref 0.0–0.1)
IMMATURE GRANULOCYTES: 0 %
LYMPHS: 30 %
Lymphocytes Absolute: 1.1 10*3/uL (ref 0.7–3.1)
MCH: 26.7 pg (ref 26.6–33.0)
MCHC: 32.1 g/dL (ref 31.5–35.7)
MCV: 83 fL (ref 79–97)
MONOCYTES: 11 %
Monocytes Absolute: 0.4 10*3/uL (ref 0.1–0.9)
NEUTROS PCT: 54 %
Neutrophils Absolute: 1.9 10*3/uL (ref 1.4–7.0)
Platelets: 306 10*3/uL (ref 150–379)
RBC: 4.19 x10E6/uL (ref 3.77–5.28)
RDW: 14.2 % (ref 12.3–15.4)
WBC: 3.5 10*3/uL (ref 3.4–10.8)

## 2016-12-26 LAB — TSH: TSH: 1.34 u[IU]/mL (ref 0.450–4.500)

## 2016-12-26 LAB — PROLACTIN: Prolactin: 13.1 ng/mL (ref 4.8–23.3)

## 2017-01-03 LAB — IGP,RFX APTIMA HPV ALL PTH: PAP Smear Comment: 0

## 2017-01-03 LAB — HPV APTIMA: HPV Aptima: POSITIVE — AB

## 2017-01-04 ENCOUNTER — Encounter: Payer: Self-pay | Admitting: Certified Nurse Midwife

## 2017-01-04 DIAGNOSIS — F329 Major depressive disorder, single episode, unspecified: Secondary | ICD-10-CM | POA: Insufficient documentation

## 2017-01-04 DIAGNOSIS — Z62810 Personal history of physical and sexual abuse in childhood: Secondary | ICD-10-CM | POA: Insufficient documentation

## 2017-01-04 DIAGNOSIS — Z8742 Personal history of other diseases of the female genital tract: Secondary | ICD-10-CM | POA: Insufficient documentation

## 2017-01-04 DIAGNOSIS — F419 Anxiety disorder, unspecified: Secondary | ICD-10-CM

## 2017-01-04 DIAGNOSIS — F32A Depression, unspecified: Secondary | ICD-10-CM | POA: Insufficient documentation

## 2017-01-08 ENCOUNTER — Telehealth: Payer: Self-pay | Admitting: Certified Nurse Midwife

## 2017-01-08 NOTE — Telephone Encounter (Signed)
Called patient with blood work and Pap results. BMP, lipid panel, TSH and prolactin were all normal. Pap was ASCUS with positive HRHPV. Has had abnormal Pap smears since 2013. Discussed plan with DR Tiburcio PeaHarris. He recommends colpo with him. Patient advised of plan and she agrees. Had appt for ultrasound and FU with me next week. Will have to reschedule do to a work meeting. She will call back to reschedule gyn ultrasound and to schedule colpo on same day. She can follow up with DR Tiburcio PeaHarris or me for gyn ultrasound.

## 2017-01-15 ENCOUNTER — Other Ambulatory Visit: Payer: 59

## 2017-01-15 ENCOUNTER — Ambulatory Visit: Payer: 59 | Admitting: Certified Nurse Midwife

## 2017-01-29 ENCOUNTER — Ambulatory Visit (INDEPENDENT_AMBULATORY_CARE_PROVIDER_SITE_OTHER): Payer: 59 | Admitting: Obstetrics & Gynecology

## 2017-01-29 ENCOUNTER — Ambulatory Visit (INDEPENDENT_AMBULATORY_CARE_PROVIDER_SITE_OTHER): Payer: 59

## 2017-01-29 ENCOUNTER — Encounter: Payer: Self-pay | Admitting: Obstetrics & Gynecology

## 2017-01-29 VITALS — BP 100/60 | HR 93 | Ht 67.0 in | Wt 118.0 lb

## 2017-01-29 DIAGNOSIS — R87610 Atypical squamous cells of undetermined significance on cytologic smear of cervix (ASC-US): Secondary | ICD-10-CM

## 2017-01-29 DIAGNOSIS — N870 Mild cervical dysplasia: Secondary | ICD-10-CM | POA: Diagnosis not present

## 2017-01-29 DIAGNOSIS — N72 Inflammatory disease of cervix uteri: Secondary | ICD-10-CM | POA: Diagnosis not present

## 2017-01-29 DIAGNOSIS — N946 Dysmenorrhea, unspecified: Secondary | ICD-10-CM

## 2017-01-29 DIAGNOSIS — N921 Excessive and frequent menstruation with irregular cycle: Secondary | ICD-10-CM

## 2017-01-29 DIAGNOSIS — R87810 Cervical high risk human papillomavirus (HPV) DNA test positive: Secondary | ICD-10-CM

## 2017-01-29 DIAGNOSIS — N841 Polyp of cervix uteri: Secondary | ICD-10-CM | POA: Diagnosis not present
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
## 2017-01-29 NOTE — Patient Instructions (Signed)

## 2017-02-02 LAB — PATHOLOGY

## 2017-02-16 ENCOUNTER — Ambulatory Visit (INDEPENDENT_AMBULATORY_CARE_PROVIDER_SITE_OTHER): Payer: 59 | Admitting: Family Medicine

## 2017-02-16 ENCOUNTER — Encounter: Payer: Self-pay | Admitting: Family Medicine

## 2017-02-16 VITALS — BP 104/70 | HR 90 | Temp 98.4°F | Wt 122.0 lb

## 2017-02-16 DIAGNOSIS — M9905 Segmental and somatic dysfunction of pelvic region: Secondary | ICD-10-CM | POA: Diagnosis not present

## 2017-02-16 DIAGNOSIS — M9904 Segmental and somatic dysfunction of sacral region: Secondary | ICD-10-CM

## 2017-02-16 DIAGNOSIS — M5441 Lumbago with sciatica, right side: Secondary | ICD-10-CM | POA: Diagnosis not present

## 2017-02-16 DIAGNOSIS — M9903 Segmental and somatic dysfunction of lumbar region: Secondary | ICD-10-CM

## 2017-02-16 DIAGNOSIS — M9902 Segmental and somatic dysfunction of thoracic region: Secondary | ICD-10-CM

## 2017-02-16 NOTE — Progress Notes (Signed)
BP 104/70 (BP Location: Left Arm, Patient Position: Sitting, Cuff Size: Normal)   Pulse 90   Temp 98.4 F (36.9 C)   Wt 122 lb (55.3 kg)   LMP 01/26/2017   SpO2 100%   BMI 19.11 kg/m    Subjective:    Patient ID: Courtney Rivers, female    DOB: 12/20/1985, 31 y.o.   MRN: [id number]  HPI: Courtney Rivers is a 31 y.o. female  Chief Complaint  Patient presents with  . Back Pain   BACK PAIN Duration: Last Thursday, went to press down on her recliner and felt something spasm in her back. Has been hurting ever since Mechanism of injury: no trauma Location: midline and low back Onset: sudden Severity: severe Quality: sharp and aching Frequency: constant Radiation: R leg below the knee Aggravating factors: sitting Alleviating factors: rest and laying Status: constant Treatments attempted: rest, heat and APAP  Relief with NSAIDs?: no Nighttime pain:  no Paresthesias / decreased sensation:  no Bowel / bladder incontinence:  no Fevers:  no Dysuria / urinary frequency:  no  Relevant past medical, surgical, family and social history reviewed and updated as indicated. Interim medical history since our last visit reviewed. Allergies and medications reviewed and updated.  Review of Systems  Constitutional: Negative.   Respiratory: Negative.   Cardiovascular: Negative.   Musculoskeletal: Positive for back pain and myalgias. Negative for arthralgias, gait problem, joint swelling, neck pain and neck stiffness.  Psychiatric/Behavioral: Negative.     Per HPI unless specifically indicated above     Objective:    BP 104/70 (BP Location: Left Arm, Patient Position: Sitting, Cuff Size: Normal)   Pulse 90   Temp 98.4 F (36.9 C)   Wt 122 lb (55.3 kg)   LMP 01/26/2017   SpO2 100%   BMI 19.11 kg/m   Wt Readings from Last 3 Encounters:  02/16/17 122 lb (55.3 kg)  01/29/17 118 lb (53.5 kg)  12/25/16 119 lb (54 kg)    Physical Exam  Constitutional: She is  oriented to person, place, and time. She appears well-developed and well-nourished. No distress.  HENT:  Head: Normocephalic and atraumatic.  Right Ear: Hearing normal.  Left Ear: Hearing normal.  Nose: Nose normal.  Eyes: Conjunctivae and lids are normal. Right eye exhibits no discharge. Left eye exhibits no discharge. No scleral icterus.  Pulmonary/Chest: Effort normal. No respiratory distress.  Abdominal: Soft. She exhibits no distension and no mass. There is no tenderness. There is no rebound and no guarding.  Musculoskeletal: Normal range of motion.  Neurological: She is alert and oriented to person, place, and time.  Skin: Skin is warm, dry and intact. No rash noted. No erythema. No pallor.  Psychiatric: She has a normal mood and affect. Her speech is normal and behavior is normal. Judgment and thought content normal. Cognition and memory are normal.  Musculoskeletal:  Exam found Decreased ROM, Tissue texture changes, Tenderness to palpation and Asymmetry of patient's  thorax, lumbar, pelvis and sacrum Osteopathic Structural Exam:   Thorax: T10-12SLRR  Lumbar: psoas spasm on the R, L3-5SLRR, L1-2SRRL  Pelvis: Posterior R innominate  Sacrum: R on L torsion   Results for orders placed or performed in visit on 01/29/17  Pathology  Result Value Ref Range   . Comment    . Comment    . Comment    . Comment    . Comment    . Comment    . Comment  Assessment & Plan:   Problem List Items Addressed This Visit    None    Visit Diagnoses    Acute midline low back pain with right-sided sciatica    -  Primary   Appears to be actuely structural due to backwards torsion. Patient treated today with OMM as below with resolution of her pain. Will follow up PRN.   Thoracic region somatic dysfunction       Lumbar region somatic dysfunction       Sacral region somatic dysfunction       Pelvic somatic dysfunction         After verbal consent was obtained, patient was treated today  with osteopathic manipulative medicine to the regions of the thorax, lumbar, pelvis and sacrum using the techniques of myofascial release, counterstrain, muscle energy, HVLA and soft tissue. Areas of compensation relating to her primary pain source also treated. Patient tolerated the procedure well with good objective and good subjective improvement in symptoms. She left the room in good condition. She was advised to stay well hydrated and that she may have some soreness following the procedure. If not improving or worsening, she will call and come in. She will return for reevaluation  on a PRN basis.   Follow up plan: Return if symptoms worsen or fail to improve.

## 2017-02-19 ENCOUNTER — Encounter: Payer: Self-pay | Admitting: Certified Nurse Midwife

## 2017-02-19 NOTE — Telephone Encounter (Signed)
Ly.

## 2017-02-25 ENCOUNTER — Ambulatory Visit (INDEPENDENT_AMBULATORY_CARE_PROVIDER_SITE_OTHER): Payer: 59 | Admitting: Certified Nurse Midwife

## 2017-02-25 ENCOUNTER — Encounter: Payer: Self-pay | Admitting: Certified Nurse Midwife

## 2017-02-25 VITALS — BP 98/58 | HR 89 | Ht 68.0 in | Wt 121.0 lb

## 2017-02-25 DIAGNOSIS — R102 Pelvic and perineal pain: Secondary | ICD-10-CM

## 2017-02-25 DIAGNOSIS — N946 Dysmenorrhea, unspecified: Secondary | ICD-10-CM | POA: Diagnosis not present

## 2017-02-25 DIAGNOSIS — N941 Unspecified dyspareunia: Secondary | ICD-10-CM

## 2017-02-25 DIAGNOSIS — N921 Excessive and frequent menstruation with irregular cycle: Secondary | ICD-10-CM | POA: Diagnosis not present

## 2017-02-25 DIAGNOSIS — N898 Other specified noninflammatory disorders of vagina: Secondary | ICD-10-CM | POA: Diagnosis not present

## 2017-02-25 DIAGNOSIS — R1030 Lower abdominal pain, unspecified: Secondary | ICD-10-CM | POA: Diagnosis not present

## 2017-02-25 DIAGNOSIS — N939 Abnormal uterine and vaginal bleeding, unspecified: Secondary | ICD-10-CM | POA: Diagnosis not present

## 2017-02-26 LAB — BETA HCG QUANT (REF LAB): hCG Quant: <1 m[IU]/mL

## 2017-03-02 DIAGNOSIS — N921 Excessive and frequent menstruation with irregular cycle: Secondary | ICD-10-CM | POA: Insufficient documentation

## 2017-03-02 DIAGNOSIS — N946 Dysmenorrhea, unspecified: Secondary | ICD-10-CM | POA: Insufficient documentation

## 2017-03-02 HISTORY — DX: Dysmenorrhea, unspecified: N94.6

## 2017-03-02 LAB — POCT WET PREP (WET MOUNT): Trichomonas Wet Prep HPF POC: ABSENT
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
## 2017-03-13 ENCOUNTER — Ambulatory Visit: Payer: 59 | Admitting: Family Medicine

## 2017-03-27 ENCOUNTER — Ambulatory Visit (INDEPENDENT_AMBULATORY_CARE_PROVIDER_SITE_OTHER): Payer: 59 | Admitting: Obstetrics & Gynecology

## 2017-03-27 ENCOUNTER — Encounter: Payer: Self-pay | Admitting: Obstetrics & Gynecology

## 2017-03-27 VITALS — BP 100/60 | HR 85 | Ht 67.0 in | Wt 124.0 lb

## 2017-03-27 DIAGNOSIS — N800 Endometriosis of uterus: Secondary | ICD-10-CM

## 2017-03-27 DIAGNOSIS — N8003 Adenomyosis of the uterus: Secondary | ICD-10-CM | POA: Insufficient documentation

## 2017-03-27 DIAGNOSIS — N809 Endometriosis, unspecified: Secondary | ICD-10-CM

## 2017-03-27 DIAGNOSIS — N921 Excessive and frequent menstruation with irregular cycle: Secondary | ICD-10-CM | POA: Diagnosis not present

## 2017-03-27 DIAGNOSIS — R102 Pelvic and perineal pain: Secondary | ICD-10-CM | POA: Diagnosis not present

## 2017-03-27 HISTORY — DX: Pelvic and perineal pain: R10.2

## 2017-03-27 HISTORY — DX: Endometriosis of uterus: N80.0

## 2017-03-27 HISTORY — DX: Adenomyosis of the uterus: N80.03
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
## 2017-03-27 NOTE — Patient Instructions (Signed)
Total Laparoscopic Hysterectomy °A total laparoscopic hysterectomy is a minimally invasive surgery to remove your uterus and cervix. This surgery is performed by making several small cuts (incisions) in your abdomen. It can also be done with a thin, lighted tube (laparoscope) inserted into two small incisions in your lower abdomen. Your fallopian tubes and ovaries can be removed (bilateral salpingo-oophorectomy) during this surgery as well. Benefits of minimally invasive surgery include: °· Less pain. °· Less risk of blood loss. °· Less risk of infection. °· Quicker return to normal activities. ° °Tell a health care provider about: °· Any allergies you have. °· All medicines you are taking, including vitamins, herbs, eye drops, creams, and over-the-counter medicines. °· Any problems you or family members have had with anesthetic medicines. °· Any blood disorders you have. °· Any surgeries you have had. °· Any medical conditions you have. °What are the risks? °Generally, this is a safe procedure. However, as with any procedure, complications can occur. Possible complications include: °· Bleeding. °· Blood clots in the legs or lung. °· Infection. °· Injury to surrounding organs. °· Problems with anesthesia. °· Early menopause symptoms (hot flashes, night sweats, insomnia). °· Risk of conversion to an open abdominal incision. ° °What happens before the procedure? °· Ask your health care provider about changing or stopping your regular medicines. °· Do not take aspirin or blood thinners (anticoagulants) for 1 week before the surgery or as told by your health care provider. °· Do not eat or drink anything for 8 hours before the surgery or as told by your health care provider. °· Quit smoking if you smoke. °· Arrange for a ride home after surgery and for someone to help you at home during recovery. °What happens during the procedure? °· You will be given antibiotic medicine. °· An IV tube will be placed in your arm. You  will be given medicine to make you sleep (general anesthetic). °· A gas (carbon dioxide) will be used to inflate your abdomen. This will allow your surgeon to look inside your abdomen, perform your surgery, and treat any other problems found if necessary. °· Three or four small incisions (often less than 1/2 inch) will be made in your abdomen. One of these incisions will be made in the area of your belly button (navel). The laparoscope will be inserted into the incision. Your surgeon will look through the laparoscope while doing your procedure. °· Other surgical instruments will be inserted through the other incisions. °· Your uterus may be removed through your vagina or cut into small pieces and removed through the small incisions. °· Your incisions will be closed. °What happens after the procedure? °· The gas will be released from inside your abdomen. °· You will be taken to the recovery area where a nurse will watch and check your progress. Once you are awake, stable, and taking fluids well, without other problems, you will return to your room or be allowed to go home. °· There is usually minimal discomfort following the surgery because the incisions are so small. °· You will be given pain medicine while you are in the hospital and for when you go home. °This information is not intended to replace advice given to you by your health care provider. Make sure you discuss any questions you have with your health care provider. °Document Released: 05/25/2007 Document Revised: 01/03/2016 Document Reviewed: 02/15/2013 °Elsevier Interactive Patient Education © 2017 [employer] ° °

## 2017-03-28 LAB — CBC
Hematocrit: 34.1 % (ref 34.0–46.6)
Hemoglobin: 10.4 g/dL — ABNORMAL LOW (ref 11.1–15.9)
MCH: 24.6 pg — ABNORMAL LOW (ref 26.6–33.0)
MCHC: 30.5 g/dL — ABNORMAL LOW (ref 31.5–35.7)
MCV: 81 fL (ref 79–97)
PLATELETS: 391 10*3/uL — AB (ref 150–379)
RBC: 4.22 x10E6/uL (ref 3.77–5.28)
RDW: 14.6 % (ref 12.3–15.4)
WBC: 4.4 10*3/uL (ref 3.4–10.8)

## 2017-03-30 ENCOUNTER — Telehealth: Payer: Self-pay | Admitting: Obstetrics & Gynecology

## 2017-03-30 NOTE — Telephone Encounter (Signed)
I returned the call at the work# listed below, but there was no answer and the v/m listed someone else's name so I did not leave a message.

## 2017-03-30 NOTE — Telephone Encounter (Signed)
Patient returning call, please call her back at her work #[phone], direct number to her.

## 2017-03-30 NOTE — Telephone Encounter (Signed)
Patient is aware of H&P at [facility] on 04/08/17 @ 4:10pm w/ Dr. Tiburcio Pea, Pre-admit Testing phone interview following, and OR on 04/16/17. Ext given.

## 2017-03-30 NOTE — Telephone Encounter (Signed)
-----   Message from Nadara Mustard, MD sent at 03/27/2017  5:20 PM EDT ----- Regarding: surgery Surgery Booking Request Patient Full Name:   MRN: [id number]  DOB: 1985-09-14  Surgeon: Letitia Libra, MD  Requested Surgery Date and Time: 04/16/17 Primary Diagnosis AND Code: N92.1 Menometrorrhagia Secondary Diagnosis and Code:  Surgical Procedure: TLH/BS L&D Notification: No Admission Status: same day surgery Length of Surgery: 1 Special Case Needs: no H&P: yes (date) Phone Interview???: yes Interpreter: Language:  Medical Clearance: no Special Scheduling Instructions: no

## 2017-03-30 NOTE — Telephone Encounter (Signed)
No answer, vm not set up.

## 2017-04-06 ENCOUNTER — Encounter: Payer: Self-pay | Admitting: Obstetrics & Gynecology

## 2017-04-06 ENCOUNTER — Ambulatory Visit (INDEPENDENT_AMBULATORY_CARE_PROVIDER_SITE_OTHER): Payer: 59 | Admitting: Obstetrics & Gynecology

## 2017-04-06 VITALS — BP 90/60 | HR 77 | Ht 67.0 in | Wt 124.0 lb

## 2017-04-06 DIAGNOSIS — N921 Excessive and frequent menstruation with irregular cycle: Secondary | ICD-10-CM

## 2017-04-06 NOTE — Patient Instructions (Signed)
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
## 2017-04-07 ENCOUNTER — Telehealth: Payer: Self-pay

## 2017-04-07 NOTE — Telephone Encounter (Signed)
FMLA/DISABILITY form for [employer] filled out and given to [place] for processing. 

## 2017-04-08 ENCOUNTER — Encounter: Payer: 59 | Admitting: Obstetrics & Gynecology

## 2017-04-09 ENCOUNTER — Encounter
Admission: RE | Admit: 2017-04-09 | Discharge: 2017-04-09 | Disposition: A | Discharge status: Home / Self Care | Payer: 59 | Source: Ambulatory Visit | Attending: Obstetrics & Gynecology | Admitting: Obstetrics & Gynecology

## 2017-04-09 HISTORY — DX: Gastro-esophageal reflux disease without esophagitis: K21.9

## 2017-04-09 HISTORY — DX: Anemia, unspecified: D64.9

## 2017-04-09 NOTE — Patient Instructions (Signed)
Your procedure is scheduled on: 04-16-17 THURSDAY Report to Same Day Surgery 2nd floor medical mall [facility](Medical Mall Entrance-take elevator on left to 2nd floor.  Check in with surgery information desk.) To find out your arrival time please call [phone] between 1PM - 3PM on 04-15-17 [facility]  Remember: Instructions that are not followed completely may result in serious medical risk, up to and including death, or upon the discretion of your surgeon and anesthesiologist your surgery may need to be rescheduled.    _x___ 1. Do not eat food after midnight the night before your procedure. NO GUM CHEWING OR HARD CANDY. You may drink clear liquids up to 2 hours before you are scheduled to arrive at the hospital for your procedure.  Do not drink clear liquids within 2 hours of your scheduled arrival to the hospital.  Clear liquids include  --Water or Apple juice without pulp  --Clear carbohydrate beverage such as ClearFast or Gatorade  --Black Coffee or Clear Tea (No milk, no creamers, do not add anything to the coffee or Tea)  Type 1 and type 2 diabetics should only drink water.     __x__ 2. No Alcohol for 24 hours before or after surgery.   __x__3. No Smoking for 24 prior to surgery.   ____  4. Bring all medications with you on the day of surgery if instructed.    __x__ 5. Notify your doctor if there is any change in your medical condition     (cold, fever, infections).     Do not wear jewelry, make-up, hairpins, clips or nail polish.  Do not wear lotions, powders, or perfumes. You may wear deodorant.  Do not shave 48 hours prior to surgery. Men may shave face and neck.  Do not bring valuables to the hospital.    [facility] any belongings or valuables.               Contacts, dentures or bridgework may not be worn into surgery.  Leave your suitcase in the car. After surgery it may be brought to your room.  For patients admitted to the hospital, discharge time is  determined by your treatment team.   Patients discharged the day of surgery will not be allowed to drive home.  You will need someone to drive you home and stay with you the night of your procedure.    Please read over the following fact sheets that you were given:   [facility] and or MRSA Information   _x___ TAKE THE FOLLOWING MEDICATION THE MORNING OF SURGERY WITH A SMALL SIP OF WATER. These include:  1. ZOLOFT (SERTRALINE)  2.  3.  4.  5.  6.  ____Fleets enema or Magnesium Citrate as directed.   _x___ Use CHG Soap or sage wipes as directed on instruction sheet   ____ Use inhalers on the day of surgery and bring to hospital day of surgery  ____ Stop Metformin and Janumet 2 days prior to surgery.    ____ Take 1/2 of usual insulin dose the night before surgery and none on the morning surgery.   ____ Follow recommendations from Cardiologist, Pulmonologist or PCP regarding stopping Aspirin, Coumadin, Plavix ,Eliquis, Effient, or Pradaxa, and Pletal.  X____Stop Anti-inflammatories such as Advil, Aleve, Ibuprofen, Motrin, Naproxen, Naprosyn, Goodies powders or aspirin products NOW-OK to take Tylenol    ____ Stop supplements until after surgery.     ____ Bring C-Pap to the hospital.

## 2017-04-10 ENCOUNTER — Telehealth: Payer: Self-pay | Admitting: Obstetrics & Gynecology

## 2017-04-10 NOTE — Telephone Encounter (Signed)
Tried calling patient to let her know that her FMLA paperwork has been received. Patient will need to come by the office to fill out forms and pay fee. Patient voice mail box not set up to leave message. Please let her know if she calls back.

## 2017-04-10 NOTE — Telephone Encounter (Signed)
Patient returned call and will come by the office for the forms and fee.

## 2017-04-14 ENCOUNTER — Inpatient Hospital Stay: Admission: RE | Admit: 2017-04-14 | Payer: 59 | Source: Ambulatory Visit

## 2017-04-15 ENCOUNTER — Encounter
Admission: RE | Admit: 2017-04-15 | Discharge: 2017-04-15 | Disposition: A | Discharge status: Home / Self Care | Payer: 59 | Source: Ambulatory Visit | Attending: Obstetrics & Gynecology | Admitting: Obstetrics & Gynecology

## 2017-04-15 DIAGNOSIS — F329 Major depressive disorder, single episode, unspecified: Secondary | ICD-10-CM | POA: Diagnosis not present

## 2017-04-15 DIAGNOSIS — F419 Anxiety disorder, unspecified: Secondary | ICD-10-CM | POA: Diagnosis not present

## 2017-04-15 DIAGNOSIS — N870 Mild cervical dysplasia: Secondary | ICD-10-CM | POA: Diagnosis not present

## 2017-04-15 DIAGNOSIS — N921 Excessive and frequent menstruation with irregular cycle: Secondary | ICD-10-CM | POA: Diagnosis not present

## 2017-04-15 DIAGNOSIS — N838 Other noninflammatory disorders of ovary, fallopian tube and broad ligament: Secondary | ICD-10-CM | POA: Diagnosis not present

## 2017-04-15 LAB — CBC
HCT: 34.6 % — ABNORMAL LOW (ref 35.0–47.0)
Hemoglobin: 11.2 g/dL — ABNORMAL LOW (ref 12.0–16.0)
MCH: 26.2 pg (ref 26.0–34.0)
MCHC: 32.4 g/dL (ref 32.0–36.0)
MCV: 80.9 fL (ref 80.0–100.0)
PLATELETS: 237 10*3/uL (ref 150–440)
RBC: 4.27 MIL/uL (ref 3.80–5.20)
RDW: 16.3 % — AB (ref 11.5–14.5)
WBC: 4.2 10*3/uL (ref 3.6–11.0)

## 2017-04-15 LAB — TYPE AND SCREEN
ABO/RH(D): B POS
Antibody Screen: NEGATIVE

## 2017-04-15 MED ORDER — CLINDAMYCIN PHOSPHATE 900 MG/50ML IV SOLN
900.0000 mg | INTRAVENOUS | Status: AC
  Administered 2017-04-16: 900 mg via INTRAVENOUS

## 2017-04-15 MED ORDER — CIPROFLOXACIN IN D5W 400 MG/200ML IV SOLN
400.0000 mg | INTRAVENOUS | Status: AC
  Administered 2017-04-16: 400 mg via INTRAVENOUS

## 2017-04-16 ENCOUNTER — Ambulatory Visit: Payer: 59 | Admitting: Anesthesiology

## 2017-04-16 ENCOUNTER — Encounter: Payer: Self-pay | Admitting: Anesthesiology

## 2017-04-16 ENCOUNTER — Encounter
Admission: RE | Disposition: A | Discharge status: Home / Self Care | Payer: Self-pay | Source: Ambulatory Visit | Attending: Obstetrics & Gynecology

## 2017-04-16 ENCOUNTER — Ambulatory Visit
Admission: RE | Admit: 2017-04-16 | Discharge: 2017-04-16 | Disposition: A | Discharge status: Home / Self Care | Payer: 59 | Source: Ambulatory Visit | Attending: Obstetrics & Gynecology | Admitting: Obstetrics & Gynecology

## 2017-04-16 DIAGNOSIS — F419 Anxiety disorder, unspecified: Secondary | ICD-10-CM | POA: Diagnosis not present

## 2017-04-16 DIAGNOSIS — Z8742 Personal history of other diseases of the female genital tract: Secondary | ICD-10-CM

## 2017-04-16 DIAGNOSIS — N809 Endometriosis, unspecified: Secondary | ICD-10-CM

## 2017-04-16 DIAGNOSIS — N870 Mild cervical dysplasia: Secondary | ICD-10-CM | POA: Insufficient documentation

## 2017-04-16 DIAGNOSIS — F329 Major depressive disorder, single episode, unspecified: Secondary | ICD-10-CM | POA: Diagnosis not present

## 2017-04-16 DIAGNOSIS — N921 Excessive and frequent menstruation with irregular cycle: Secondary | ICD-10-CM | POA: Diagnosis present

## 2017-04-16 DIAGNOSIS — N800 Endometriosis of uterus: Secondary | ICD-10-CM | POA: Diagnosis present

## 2017-04-16 DIAGNOSIS — R102 Pelvic and perineal pain unspecified side: Secondary | ICD-10-CM | POA: Diagnosis present

## 2017-04-16 DIAGNOSIS — N838 Other noninflammatory disorders of ovary, fallopian tube and broad ligament: Secondary | ICD-10-CM | POA: Diagnosis not present

## 2017-04-16 DIAGNOSIS — N8003 Adenomyosis of the uterus: Secondary | ICD-10-CM | POA: Diagnosis present

## 2017-04-16 HISTORY — PX: LAPAROSCOPIC HYSTERECTOMY: SHX1926

## 2017-04-16 LAB — POCT PREGNANCY, URINE: PREG TEST UR: NEGATIVE

## 2017-04-16 SURGERY — HYSTERECTOMY, TOTAL, LAPAROSCOPIC
Anesthesia: General | Laterality: Bilateral | Wound class: Clean Contaminated

## 2017-04-16 MED ORDER — MIDAZOLAM HCL 2 MG/2ML IJ SOLN
INTRAMUSCULAR | Status: AC
  Filled 2017-04-16: qty 2

## 2017-04-16 MED ORDER — FENTANYL CITRATE (PF) 250 MCG/5ML IJ SOLN
INTRAMUSCULAR | Status: AC
  Filled 2017-04-16: qty 5

## 2017-04-16 MED ORDER — KETOROLAC TROMETHAMINE 30 MG/ML IJ SOLN
INTRAMUSCULAR | Status: AC
  Filled 2017-04-16: qty 1

## 2017-04-16 MED ORDER — KETOROLAC TROMETHAMINE 30 MG/ML IJ SOLN: 30.0000 mg | Freq: Four times a day (QID) | INTRAMUSCULAR | Status: DC

## 2017-04-16 MED ORDER — BUPIVACAINE HCL (PF) 0.5 % IJ SOLN
INTRAMUSCULAR | Status: AC
  Filled 2017-04-16: qty 30

## 2017-04-16 MED ORDER — SODIUM CHLORIDE 0.9 % IJ SOLN
INTRAMUSCULAR | Status: AC
  Filled 2017-04-16: qty 10

## 2017-04-16 MED ORDER — LIDOCAINE HCL (PF) 2 % IJ SOLN
INTRAMUSCULAR | Status: AC
  Filled 2017-04-16: qty 2

## 2017-04-16 MED ORDER — CLINDAMYCIN PHOSPHATE 900 MG/50ML IV SOLN
INTRAVENOUS | Status: AC
  Filled 2017-04-16: qty 50

## 2017-04-16 MED ORDER — DEXAMETHASONE SODIUM PHOSPHATE 10 MG/ML IJ SOLN
INTRAMUSCULAR | Status: DC | PRN
  Administered 2017-04-16: 8 mg via INTRAVENOUS

## 2017-04-16 MED ORDER — MIDAZOLAM HCL 2 MG/2ML IJ SOLN
INTRAMUSCULAR | Status: DC | PRN
  Administered 2017-04-16 (×2): 1 mg via INTRAVENOUS

## 2017-04-16 MED ORDER — ACETAMINOPHEN NICU IV SYRINGE 10 MG/ML
INTRAVENOUS | Status: AC
  Filled 2017-04-16: qty 1

## 2017-04-16 MED ORDER — ROCURONIUM BROMIDE 100 MG/10ML IV SOLN
INTRAVENOUS | Status: DC | PRN
  Administered 2017-04-16: 10 mg via INTRAVENOUS
  Administered 2017-04-16: 30 mg via INTRAVENOUS

## 2017-04-16 MED ORDER — LACTATED RINGERS IV SOLN
INTRAVENOUS | Status: DC
  Administered 2017-04-16 (×2): via INTRAVENOUS

## 2017-04-16 MED ORDER — ONDANSETRON HCL 4 MG/2ML IJ SOLN
4.0000 mg | Freq: Once | INTRAMUSCULAR | Status: AC | PRN
  Administered 2017-04-16: 4 mg via INTRAVENOUS

## 2017-04-16 MED ORDER — PROPOFOL 10 MG/ML IV BOLUS
INTRAVENOUS | Status: DC | PRN
  Administered 2017-04-16: 100 mg via INTRAVENOUS

## 2017-04-16 MED ORDER — DEXAMETHASONE SODIUM PHOSPHATE 10 MG/ML IJ SOLN
INTRAMUSCULAR | Status: AC
  Filled 2017-04-16: qty 1

## 2017-04-16 MED ORDER — ONDANSETRON HCL 4 MG/2ML IJ SOLN
INTRAMUSCULAR | Status: AC
  Filled 2017-04-16: qty 2

## 2017-04-16 MED ORDER — FENTANYL CITRATE (PF) 100 MCG/2ML IJ SOLN
25.0000 ug | INTRAMUSCULAR | Status: DC | PRN
  Administered 2017-04-16 (×3): 25 ug via INTRAVENOUS

## 2017-04-16 MED ORDER — PROPOFOL 10 MG/ML IV BOLUS
INTRAVENOUS | Status: AC
  Filled 2017-04-16: qty 20

## 2017-04-16 MED ORDER — PROMETHAZINE HCL 25 MG/ML IJ SOLN
6.2500 mg | Freq: Four times a day (QID) | INTRAMUSCULAR | Status: DC | PRN
  Administered 2017-04-16: 6.25 mg via INTRAVENOUS

## 2017-04-16 MED ORDER — FENTANYL CITRATE (PF) 100 MCG/2ML IJ SOLN
INTRAMUSCULAR | Status: AC
  Administered 2017-04-16: 25 ug via INTRAVENOUS
  Filled 2017-04-16: qty 2

## 2017-04-16 MED ORDER — LACTATED RINGERS IV SOLN
INTRAVENOUS | Status: DC
Start: 2017-04-16 — End: 2017-04-16

## 2017-04-16 MED ORDER — OXYCODONE-ACETAMINOPHEN 5-325 MG PO TABS: 1.0000 | ORAL_TABLET | ORAL | 0 refills | Status: DC | PRN

## 2017-04-16 MED ORDER — FAMOTIDINE 20 MG PO TABS
20.0000 mg | ORAL_TABLET | Freq: Once | ORAL | Status: AC
  Administered 2017-04-16: 20 mg via ORAL

## 2017-04-16 MED ORDER — ROCURONIUM BROMIDE 50 MG/5ML IV SOLN
INTRAVENOUS | Status: AC
  Filled 2017-04-16: qty 1

## 2017-04-16 MED ORDER — PROMETHAZINE HCL 25 MG/ML IJ SOLN
INTRAMUSCULAR | Status: AC
  Administered 2017-04-16: 6.25 mg via INTRAVENOUS
  Filled 2017-04-16: qty 1

## 2017-04-16 MED ORDER — KETOROLAC TROMETHAMINE 30 MG/ML IJ SOLN
INTRAMUSCULAR | Status: DC | PRN
  Administered 2017-04-16: 30 mg via INTRAVENOUS

## 2017-04-16 MED ORDER — BUPIVACAINE HCL (PF) 0.5 % IJ SOLN
INTRAMUSCULAR | Status: DC | PRN
  Administered 2017-04-16: 9 mL

## 2017-04-16 MED ORDER — ACETAMINOPHEN 325 MG PO TABS: 650.0000 mg | ORAL_TABLET | ORAL | Status: DC | PRN

## 2017-04-16 MED ORDER — CIPROFLOXACIN IN D5W 400 MG/200ML IV SOLN
INTRAVENOUS | Status: AC
  Filled 2017-04-16: qty 200

## 2017-04-16 MED ORDER — FENTANYL CITRATE (PF) 100 MCG/2ML IJ SOLN
INTRAMUSCULAR | Status: DC | PRN
  Administered 2017-04-16 (×2): 50 ug via INTRAVENOUS

## 2017-04-16 MED ORDER — ACETAMINOPHEN 650 MG RE SUPP
650.0000 mg | RECTAL | Status: DC | PRN
  Filled 2017-04-16: qty 1

## 2017-04-16 MED ORDER — MORPHINE SULFATE (PF) 4 MG/ML IV SOLN: 1.0000 mg | INTRAVENOUS | Status: DC | PRN

## 2017-04-16 MED ORDER — ONDANSETRON HCL 4 MG/2ML IJ SOLN
INTRAMUSCULAR | Status: DC | PRN
Start: 2017-04-16 — End: 2017-04-16
  Administered 2017-04-16: 4 mg via INTRAVENOUS

## 2017-04-16 MED ORDER — ACETAMINOPHEN 10 MG/ML IV SOLN
INTRAVENOUS | Status: DC | PRN
  Administered 2017-04-16: 1000 mg via INTRAVENOUS

## 2017-04-16 MED ORDER — SUGAMMADEX SODIUM 200 MG/2ML IV SOLN
INTRAVENOUS | Status: AC
  Filled 2017-04-16: qty 2

## 2017-04-16 MED ORDER — FAMOTIDINE 20 MG PO TABS
ORAL_TABLET | ORAL | Status: AC
  Filled 2017-04-16: qty 1

## 2017-04-16 MED ORDER — LIDOCAINE HCL (CARDIAC) 20 MG/ML IV SOLN
INTRAVENOUS | Status: DC | PRN
  Administered 2017-04-16: 40 mg via INTRAVENOUS

## 2017-04-16 SURGICAL SUPPLY — 48 items
BAG URO DRAIN 2000ML W/SPOUT (MISCELLANEOUS) ×2 IMPLANT
BLADE SURG SZ11 CARB STEEL (BLADE) ×2 IMPLANT
CANISTER SUCT 1200ML W/VALVE (MISCELLANEOUS) ×2 IMPLANT
CATH FOLEY 2WAY  5CC 16FR (CATHETERS) ×1
CATH URTH 16FR FL 2W BLN LF (CATHETERS) ×1 IMPLANT
CHLORAPREP W/TINT 26ML (MISCELLANEOUS) ×2 IMPLANT
DEFOGGER SCOPE WARMER CLEARIFY (MISCELLANEOUS) ×2 IMPLANT
DERMABOND ADVANCED (GAUZE/BANDAGES/DRESSINGS) ×1
DERMABOND ADVANCED .7 DNX12 (GAUZE/BANDAGES/DRESSINGS) ×1 IMPLANT
DEVICE SUTURE ENDOST 10MM (ENDOMECHANICALS) ×2 IMPLANT
DRAPE CAMERA CLOSED 9X96 (DRAPES) ×2 IMPLANT
DRSG TEGADERM 2-3/8X2-3/4 SM (GAUZE/BANDAGES/DRESSINGS) IMPLANT
ENDOSTITCH 0 SINGLE 48 (SUTURE) ×12 IMPLANT
GAUZE SPONGE NON-WVN 2X2 STRL (MISCELLANEOUS) IMPLANT
GLOVE BIO SURGEON STRL SZ8 (GLOVE) ×12 IMPLANT
GLOVE INDICATOR 8.0 STRL GRN (GLOVE) ×12 IMPLANT
GOWN STRL REUS W/ TWL LRG LVL3 (GOWN DISPOSABLE) ×1 IMPLANT
GOWN STRL REUS W/ TWL XL LVL3 (GOWN DISPOSABLE) ×2 IMPLANT
GOWN STRL REUS W/TWL LRG LVL3 (GOWN DISPOSABLE) ×1
GOWN STRL REUS W/TWL XL LVL3 (GOWN DISPOSABLE) ×2
GRASPER SUT TROCAR 14GX15 (MISCELLANEOUS) ×2 IMPLANT
IRRIGATION STRYKERFLOW (MISCELLANEOUS) ×1 IMPLANT
IRRIGATOR STRYKERFLOW (MISCELLANEOUS) ×2
IV LACTATED RINGERS 1000ML (IV SOLUTION) ×2 IMPLANT
KIT PINK PAD W/HEAD ARE REST (MISCELLANEOUS) ×2
KIT PINK PAD W/HEAD ARM REST (MISCELLANEOUS) ×1 IMPLANT
KIT RM TURNOVER CYSTO AR (KITS) ×2 IMPLANT
LABEL OR SOLS (LABEL) ×2 IMPLANT
MANIPULATOR VCARE LG CRV RETR (MISCELLANEOUS) IMPLANT
MANIPULATOR VCARE SML CRV RETR (MISCELLANEOUS) IMPLANT
MANIPULATOR VCARE STD CRV RETR (MISCELLANEOUS) ×2 IMPLANT
NEEDLE VERESS 14GA 120MM (NEEDLE) ×2 IMPLANT
NS IRRIG 500ML POUR BTL (IV SOLUTION) ×2 IMPLANT
OCCLUDER COLPOPNEUMO (BALLOONS) ×2 IMPLANT
PACK GYN LAPAROSCOPIC (MISCELLANEOUS) ×2 IMPLANT
PAD OB MATERNITY 4.3X12.25 (PERSONAL CARE ITEMS) ×2 IMPLANT
PAD PREP 24X41 OB/GYN DISP (PERSONAL CARE ITEMS) ×2 IMPLANT
SCISSORS METZENBAUM CVD 33 (INSTRUMENTS) IMPLANT
SET CYSTO W/LG BORE CLAMP LF (SET/KITS/TRAYS/PACK) ×2 IMPLANT
SHEARS HARMONIC ACE PLUS 36CM (ENDOMECHANICALS) ×2 IMPLANT
SLEEVE ENDOPATH XCEL 5M (ENDOMECHANICALS) ×2 IMPLANT
SPONGE VERSALON 2X2 STRL (MISCELLANEOUS)
SUT VIC AB 0 CT1 36 (SUTURE) ×4 IMPLANT
SYR 50ML LL SCALE MARK (SYRINGE) ×2 IMPLANT
SYRINGE 10CC LL (SYRINGE) ×2 IMPLANT
TROCAR ENDO BLADELESS 11MM (ENDOMECHANICALS) ×2 IMPLANT
TROCAR XCEL NON-BLD 5MMX100MML (ENDOMECHANICALS) ×2 IMPLANT
TUBING INSUF HEATED (TUBING) ×2 IMPLANT

## 2017-04-16 NOTE — Anesthesia Post-op Follow-up Note (Signed)
Anesthesia QCDR form completed.        

## 2017-04-16 NOTE — Op Note (Signed)
Operative Report:  PRE-OP DIAGNOSIS: menometrorrhagia, pelvic pain, cervical dysplasia  POST-OP DIAGNOSIS: same  PROCEDURE: Procedure(s): HYSTERECTOMY TOTAL LAPAROSCOPIC with BILATERAL SALPINGECTOMY and CYSTOSCOPY  SURGEON: Annamarie Major, MD, FACOG  ASSISTANT: Dr Bonney Aid   ANESTHESIA: General endotracheal anesthesia  ESTIMATED BLOOD LOSS: <10 mL  SPECIMENS: Uterus, Tubes.  COMPLICATIONS: None  DISPOSITION: stable to PACU  FINDINGS: Intraabdominal adhesions were not noted. Possible endometriosis visualized especially right ovarian fossa and uterosacral ligament areas. Normal ovaries.  PROCEDURE:  The patient was taken to the OR where anesthesia was administed. She was prepped and draped in the normal sterile fashion in the dorsal lithotomy position in the [place] stirrups. A time out was performed. A Graves speculum was inserted, the cervix was grasped with a single tooth tenaculum and the endometrial cavity was sounded. The cervix was progressively dilated to a size 8 [place] with [employer] dilators. A V-Care uterine manipulator was inserted in the usual fashion without incident. Gloves were changed and attention was turned to the abdomen.   An infraumbilical transverse 5mm skin incision was made with the scalpel after local anesthesia applied to the skin. A Veress-step needle was inserted in the usual fashion and confirmed using the hanging drop technique. A pneumoperitoneum was obtained by insufflation of CO2 (opening pressure of ) to . A diagnostic laparoscopy was performed yielding the previously described findings. Attention was turned to the left lower quadrant where after visualization of the inferior epigastric vessels a 5mm skin incision was made with the scalpel. A 5 mm laparoscopic port was inserted. The same procedure was repeated in the right lower quadrant with a 11mm trocar. Attention was turned to the left aspect of the uterus, where after visualization of the ureter,  the round ligament was coagulated and transected using the 5mm Harmonic Scapel. The anterior and posterior leafs of the broad ligament were dissected off as the anterior one was coagulated and transected in a caudal direction towards the cuff of the uterine manipulator.  Attention was then turned to the left fallopian tube which was recognized by visualization of the fimbria. The tube is excised to its attachment to the uterus. The uterine-ovarian ligament and its blood vessels were carefully coagulated and transected using the Harmonic scapel.  Attention was turned to the right aspect of the uterus where the same procedure was performed.  The vesicouterine reflection of the peritoneum was dissected with the harmonic scapel and the bladder flap was created bluntly.  The uterine vessels were coagulated and transected bilaterally using first bipolar cautery and then the harmonic scapel. A 360 degree, circumferential colpotomy was done to completely amputate the uterus with cervix and tubes. Once the specimen was amputated it was delivered through the vagina.   The colpotomy was repaired in a simple interrupted fashion using a 0-Polysorb suture with an endo-stitch device.  Vaginal exam confirms complete closure.  The cavity was copiously irrigated. A survey of the pelvic cavity revealed adequate hemostasis and no injury to bowel, bladder, or ureter.   A diagnostic cystoscopy was performed using saline distension of bladder with no lesions or injuries noted.  Bilateral urine flow from each ureteral orifice is visualized.  At this point the procedure was finalized.  Right lower quadrant fascia closed with a ) Vicryl suture using a fascia closure device.  All the instruments were removed from the patient's body. Gas was expelled and patient is leveled.  Incisions are closed with skin adhesive.    Patient goes to recovery room in stable condition.  All sponge, instrument, and needle counts are correct x2.      Annamarie MajorPaul Jaselynn Tamas, MD, [facility], [facility] 04/16/2017  11:31 AM

## 2017-04-16 NOTE — Anesthesia Postprocedure Evaluation (Signed)
Anesthesia Post Note  Patient: Sharol GivenChristan M Nesby  Procedure(s) Performed: Procedure(s) (LRB): HYSTERECTOMY TOTAL LAPAROSCOPIC BILATERAL SALPINGECTOMY (Bilateral)  Patient location during evaluation: PACU Anesthesia Type: General Level of consciousness: awake and alert Pain management: pain level controlled Vital Signs Assessment: post-procedure vital signs reviewed and stable Respiratory status: spontaneous breathing, nonlabored ventilation, respiratory function stable and patient connected to nasal cannula oxygen Cardiovascular status: blood pressure returned to baseline and stable Postop Assessment: no signs of nausea or vomiting Anesthetic complications: no     Last Vitals:  Vitals:   04/16/17 1244 04/16/17 1300  BP: 109/70 101/66  Pulse: 84 81  Resp: 10 14  Temp: 36.8 C 36.7 C  SpO2: 100% 100%    Last Pain:  Vitals:   04/16/17 1300  TempSrc:   PainSc: 3                  Hiyab Nhem S

## 2017-04-16 NOTE — Anesthesia Procedure Notes (Signed)
Procedure Name: Intubation Date/Time: 04/16/2017 10:05 AM Performed by: Allean Found Pre-anesthesia Checklist: Emergency Drugs available, Suction available, Patient identified, Patient being monitored and Timeout performed Patient Re-evaluated:Patient Re-evaluated prior to induction Oxygen Delivery Method: Circle system utilized Preoxygenation: Pre-oxygenation with 100% oxygen Induction Type: IV induction Ventilation: Mask ventilation without difficulty Laryngoscope Size: Mac and 3 Grade View: Grade I Tube type: Oral Tube size: 7.0 mm Number of attempts: 1 Airway Equipment and Method: Stylet Placement Confirmation: ETT inserted through vocal cords under direct vision,  positive ETCO2 and breath sounds checked- equal and bilateral Secured at: 22 cm Tube secured with: Tape Dental Injury: Teeth and Oropharynx as per pre-operative assessment  Comments: Top overbite
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
## 2017-04-16 NOTE — Discharge Instructions (Signed)
Total Laparoscopic Hysterectomy, Care After °Refer to this sheet in the next few weeks. These instructions provide you with information on caring for yourself after your procedure. Your health care provider may also give you more specific instructions. Your treatment has been planned according to current medical practices, but problems sometimes occur. Call your health care provider if you have any problems or questions after your procedure. °What can I expect after the procedure? °· Pain and bruising at the incision sites. You will be given pain medicine to control it. °· Menopausal symptoms such as hot flashes, night sweats, and insomnia if your ovaries were removed. °· Sore throat from the breathing tube that was inserted during surgery. °Follow these instructions at home: °· Only take over-the-counter or prescription medicines for pain, discomfort, or fever as directed by your health care provider. °· Do not take aspirin. It can cause bleeding. °· Do not drive when taking pain medicine. °· Follow your health care provider's advice regarding diet, exercise, lifting, driving, and general activities. °· Resume your usual diet as directed and allowed. °· Get plenty of rest and sleep. °· Do not douche, use tampons, or have sexual intercourse for at least 6 weeks, or until your health care provider gives you permission. °· Change your bandages (dressings) as directed by your health care provider. °· Monitor your temperature and notify your health care provider of a fever. °· Take showers instead of baths for 2-3 weeks. °· Do not drink alcohol until your health care provider gives you permission. °· If you develop constipation, you may take a mild laxative with your health care provider's permission. Bran foods may help with constipation problems. Drinking enough fluids to keep your urine clear or pale yellow may help as well. °· Try to have someone home with you for 1-2 weeks to help around the house. °· Keep all of  your follow-up appointments as directed by your health care provider. °Contact a health care provider if: °· You have swelling, redness, or increasing pain around your incision sites. °· You have pus coming from your incision. °· You notice a bad smell coming from your incision. °· Your incision breaks open. °· You feel dizzy or lightheaded. °· You have pain or bleeding when you urinate. °· You have persistent diarrhea. °· You have persistent nausea and vomiting. °· You have abnormal vaginal discharge. °· You have a rash. °· You have any type of abnormal reaction or develop an allergy to your medicine. °· You have poor pain control with your prescribed medicine. °Get help right away if: °· You have chest pain or shortness of breath. °· You have severe abdominal pain that is not relieved with pain medicine. °· You have pain or swelling in your legs. °This information is not intended to replace advice given to you by your health care provider. Make sure you discuss any questions you have with your health care provider. °Document Released: 05/18/2013 Document Revised: 01/03/2016 Document Reviewed: 02/15/2013 °Elsevier Interactive Patient Education © 2017 [employer] ° °AMBULATORY SURGERY  °DISCHARGE INSTRUCTIONS ° ° °1) The drugs that you were given will stay in your system until tomorrow so for the next 24 hours you should not: ° °A) Drive an automobile °B) Make any legal decisions °C) Drink any alcoholic beverage ° ° °2) You may resume regular meals tomorrow.  Today it is better to start with liquids and gradually work up to solid foods. ° °You may eat anything you prefer, but it is better   to start with liquids, then soup and crackers, and gradually work up to solid foods. ° ° °3) Please notify your doctor immediately if you have any unusual bleeding, trouble breathing, redness and pain at the surgery site, drainage, fever, or pain not relieved by medication. ° ° ° °4) Additional Instructions: ° ° ° ° ° ° ° °Please  contact your physician with any problems or Same Day Surgery at [phone], Monday through Friday 6 am to 4 pm, or [facility] [place] Main number at [phone]. ° °

## 2017-04-16 NOTE — Transfer of Care (Signed)
Immediate Anesthesia Transfer of Care Note  Patient: Courtney Rivers  Procedure(s) Performed: Procedure(s): HYSTERECTOMY TOTAL LAPAROSCOPIC BILATERAL SALPINGECTOMY (Bilateral)  Patient Location: PACU  Anesthesia Type:General  Level of Consciousness: sedated  Airway & Oxygen Therapy: Patient Spontanous Breathing and Patient connected to face mask oxygen  Post-op Assessment: Report given to RN and Post -op Vital signs reviewed and stable  Post vital signs: Reviewed and stable  Last Vitals:  Vitals:   04/16/17 0902 04/16/17 1147  BP: 111/75 113/80  Pulse: 70 94  Resp:  (!) 1  Temp: (!) 36.3 C (!) 36.1 C  SpO2: 100% 100%    Complications: No apparent anesthesia complications

## 2017-04-17 LAB — SURGICAL PATHOLOGY

## 2017-04-20 ENCOUNTER — Encounter: Payer: Self-pay | Admitting: Obstetrics & Gynecology

## 2017-04-23 ENCOUNTER — Encounter: Payer: Self-pay | Admitting: Obstetrics & Gynecology

## 2017-04-23 ENCOUNTER — Ambulatory Visit (INDEPENDENT_AMBULATORY_CARE_PROVIDER_SITE_OTHER): Payer: 59 | Admitting: Obstetrics & Gynecology

## 2017-04-23 ENCOUNTER — Other Ambulatory Visit: Payer: Self-pay | Admitting: Family Medicine

## 2017-04-23 ENCOUNTER — Encounter: Payer: Self-pay | Admitting: Family Medicine

## 2017-04-23 VITALS — BP 90/60 | HR 59 | Ht 67.0 in | Wt 115.4 lb

## 2017-04-23 DIAGNOSIS — N921 Excessive and frequent menstruation with irregular cycle: Secondary | ICD-10-CM

## 2017-04-23 DIAGNOSIS — N3000 Acute cystitis without hematuria: Secondary | ICD-10-CM

## 2017-04-23 MED ORDER — SULFAMETHOXAZOLE-TRIMETHOPRIM 800-160 MG PO TABS: 1.0000 | ORAL_TABLET | Freq: Two times a day (BID) | ORAL | 0 refills | Status: AC

## 2017-04-23 MED ORDER — NYSTATIN 100000 UNIT/ML MT SUSP: 5.0000 mL | Freq: Four times a day (QID) | OROMUCOSAL | 1 refills | Status: DC

## 2017-04-23 NOTE — Progress Notes (Addendum)
  Postoperative Follow-up Patient presents post op from [facility] for abnormal uterine bleeding, 1 week ago.  For AUB refractory to medical therapy.  Also cervical dysplasia.  Subjective: Patient reports some improvement in her preop symptoms. Eating a regular diet without difficulty. The patient is not having any pain.  Activity: normal activities of daily living. Patient reports vaginal sx's of None (scant spotting occas episodes).  Reports dysuria last 2 days  Objective: BP 90/60   Pulse (!) 59   Ht 5\' 7"  (1.702 m)   Wt 115 lb 6.4 oz (52.3 kg)   LMP 02/25/2017   BMI 18.07 kg/m  Physical Exam  Constitutional: She is oriented to person, place, and time. She appears well-developed and well-nourished. No distress.  Cardiovascular: Normal rate.   Pulmonary/Chest: Effort normal.  Abdominal: Soft. She exhibits no distension. There is no tenderness.  Incision Healing Well   Musculoskeletal: Normal range of motion.  Neurological: She is alert and oriented to person, place, and time. No cranial nerve deficit.  Skin: Skin is warm and dry.  Psychiatric: She has a normal mood and affect.   Results for orders placed or performed during the hospital encounter of 04/16/17  Pregnancy, urine POC  Result Value Ref Range   Preg Test, Ur NEGATIVE NEGATIVE  Surgical pathology  Result Value Ref Range   SURGICAL PATHOLOGY      Surgical Pathology CASE: [id number] PATIENT: Adela PortsHRISTAN Mccluskey Surgical Pathology Report  SPECIMEN SUBMITTED: A. Uterus with cervix, bilateral tubes  CLINICAL HISTORY: None provided  PRE-OPERATIVE DIAGNOSIS: Menometrorrhagia  POST-OPERATIVE DIAGNOSIS: Same as pre-op  DIAGNOSIS: A. UTERUS WITH CERVIX AND BILATERAL FALLOPIAN TUBES; HYSTERECTOMY WITH BILATERAL SALPINGECTOMY: - LOW GRADE SQUAMOUS INTRAEPITHELIAL LESION (LSIL/CIN I). - SECRETORY ENDOMETRIUM. - PARATUBAL CYST OF THE LEFT FALLOPIAN TUBE. - UNREMARKABLE RIGHT FALLOPIAN TUBE.   UA today- +  leuk  Assessment: s/p :  total laparoscopic hysterectomy with bilateral salpingectomy stable UTI  Plan: Patient has done well after surgery with no apparent complications.  I have discussed the post-operative course to date, and the expected progress moving forward.  The patient understands what complications to be concerned about.  I will see the patient in routine follow up, or sooner if needed.   Pathology discussed. Activity plan: No heavy lifting. Pelvic rest. May return to work in 14-21 days  ABX for UTI  Letitia LibraRobert Paul Harris 04/23/2017, 9:39 AM

## 2017-04-23 NOTE — Addendum Note (Signed)
Addended by: Nadara MustardHARRIS, Zylie Mumaw P on: 04/23/2017 09:56 AM   Modules accepted: Orders

## 2017-04-24 ENCOUNTER — Ambulatory Visit: Payer: 59 | Admitting: Obstetrics & Gynecology

## 2017-05-08 ENCOUNTER — Ambulatory Visit (INDEPENDENT_AMBULATORY_CARE_PROVIDER_SITE_OTHER): Payer: 59 | Admitting: Obstetrics & Gynecology

## 2017-05-08 ENCOUNTER — Encounter: Payer: Self-pay | Admitting: Obstetrics & Gynecology

## 2017-05-08 VITALS — BP 90/60 | HR 64 | Ht 67.0 in | Wt 115.0 lb

## 2017-05-08 DIAGNOSIS — L7682 Other postprocedural complications of skin and subcutaneous tissue: Secondary | ICD-10-CM | POA: Insufficient documentation

## 2017-05-08 DIAGNOSIS — F331 Major depressive disorder, recurrent, moderate: Secondary | ICD-10-CM

## 2017-05-08 NOTE — Progress Notes (Signed)
  Interval appt for new pain Patient presents due to pain sx's; post op from [facility] for bleeding, cervical dysplasia, 3 weeks ago.  Subjective: Patient reports mild-mod pain for last 2 days at the umb and RLQ, mostly subdermal, also felt a knot below an inc in the RLQ.  No radiation.  Worse w activity.  Rest makes it better.  Context of prior surgery.    Objective: BP 90/60   Pulse 64   Ht  (1.702 m)   Wt 115 lb (52.2 kg)   LMP 02/25/2017   BMI 18.01 kg/m  Physical Exam  Constitutional: She is oriented to person, place, and time. She appears well-developed and well-nourished. No distress.  Cardiovascular: Normal rate.   Pulmonary/Chest: Effort normal.  Abdominal: Soft. She exhibits no distension. There is no tenderness.  RLQ palpable NT nodule c/w healing effect or stitch granuloma, without evidence for hematoma or abscess or infection; inc healing well and intact Umb inc healing well and without mass  Musculoskeletal: Normal range of motion.  Neurological: She is alert and oriented to person, place, and time. No cranial nerve deficit.  Skin: Skin is warm and dry.  Psychiatric: She has a normal mood and affect.    Assessment: Pain in RLQ, incisions from prior surgery s/p :  total laparoscopic hysterectomy with bilateral salpingectomy stable  Plan: No s/sx infection, hematoma, mass, other complications Heat to incisional pain areas. Tylenol as needed.  Courtney Rivers 05/08/2017, 9:06 AM

## 2017-05-11 ENCOUNTER — Telehealth: Payer: Self-pay

## 2017-05-11 NOTE — Telephone Encounter (Signed)
[employer] called to confirm date of surgery, dx for surgery.  Information given.

## 2017-05-11 NOTE — Telephone Encounter (Signed)
Per [employer], their faxed form does not need to be filled out d/t info given over the phone earlier today.

## 2017-05-25 ENCOUNTER — Encounter: Payer: Self-pay | Admitting: Obstetrics & Gynecology

## 2017-05-25 ENCOUNTER — Telehealth: Payer: Self-pay | Admitting: Obstetrics & Gynecology

## 2017-05-25 ENCOUNTER — Ambulatory Visit (INDEPENDENT_AMBULATORY_CARE_PROVIDER_SITE_OTHER): Payer: 59 | Admitting: Obstetrics & Gynecology

## 2017-05-25 VITALS — BP 120/70 | HR 74 | Ht 67.0 in | Wt 118.0 lb

## 2017-05-25 DIAGNOSIS — Z87898 Personal history of other specified conditions: Secondary | ICD-10-CM

## 2017-05-25 DIAGNOSIS — Z8742 Personal history of other diseases of the female genital tract: Secondary | ICD-10-CM

## 2017-05-25 NOTE — Telephone Encounter (Signed)
Pt forgot to ask rph if it was ok to go back to work on Monday. Please let her know.

## 2017-05-25 NOTE — Telephone Encounter (Signed)
Pt aware.

## 2017-05-25 NOTE — Progress Notes (Signed)
  Postoperative Follow-up Patient presents post op from [facility]  for cervical dysplasia and bleeding (pathology reviewed as CIN I removed), 6 weeks ago.  Subjective: Patient reports marked improvement in her preop symptoms. Eating a regular diet without difficulty. The patient is not having any pain.  Activity: normal activities of daily living. Patient reports vaginal sx's of None  Objective: BP 120/70   Pulse 74   Ht  (1.702 m)   Wt 118 lb (53.5 kg)   LMP 02/25/2017   BMI 18.48 kg/m  Physical Exam  Constitutional: She is oriented to person, place, and time. She appears well-developed and well-nourished. No distress.  Genitourinary: Rectum normal and vagina normal. Pelvic exam was performed with patient supine. There is no rash, tenderness or lesion on the right labia. There is no rash, tenderness or lesion on the left labia. No erythema or bleeding in the vagina. Right adnexum does not display mass and does not display tenderness. Left adnexum does not display mass and does not display tenderness.  Genitourinary Comments: Cervix and uterus absent. Vaginal cuff with elliptical defect like an abrasion w contact bleeding noted, no separation or dehiscence, no granulation tissue  Cardiovascular: Normal rate.   Pulmonary/Chest: Effort normal.  Abdominal: Soft. She exhibits no distension. There is no tenderness.  Incision healing well.  Musculoskeletal: Normal range of motion.  Neurological: She is alert and oriented to person, place, and time. No cranial nerve deficit.  Skin: Skin is warm and dry.  Psychiatric: She has a normal mood and affect.   Assessment: s/p :  total laparoscopic hysterectomy with bilateral salpingectomy stable  Plan: Patient has done well after surgery with no apparent complications.  I have discussed the post-operative course to date, and the expected progress moving forward.  The patient understands what complications to be concerned about.  I will see the  patient in routine follow up, or sooner if needed.    Activity plan: No restriction. Pelvic rest and recheck exam in 3 weeks.  Letitia Libra 05/25/2017, 11:47 AM

## 2017-05-25 NOTE — Telephone Encounter (Signed)
Yes, let her know

## 2017-05-26 ENCOUNTER — Ambulatory Visit: Payer: 59 | Admitting: Obstetrics & Gynecology

## 2017-05-27 ENCOUNTER — Telehealth: Payer: Self-pay

## 2017-05-27 NOTE — Telephone Encounter (Signed)
Pt needs return to work date on [employer]MLA form to be 10/22nd instead of the 18th.  Pt aware it has been done.

## 2017-05-29 ENCOUNTER — Ambulatory Visit: Payer: 59 | Admitting: Obstetrics & Gynecology

## 2017-06-16 ENCOUNTER — Encounter: Payer: Self-pay | Admitting: Obstetrics & Gynecology

## 2017-06-16 ENCOUNTER — Ambulatory Visit (INDEPENDENT_AMBULATORY_CARE_PROVIDER_SITE_OTHER): Payer: 59 | Admitting: Obstetrics & Gynecology

## 2017-06-16 VITALS — BP 100/60 | HR 69 | Ht 67.0 in | Wt 121.0 lb

## 2017-06-16 DIAGNOSIS — Z8742 Personal history of other diseases of the female genital tract: Secondary | ICD-10-CM

## 2017-06-16 DIAGNOSIS — L7682 Other postprocedural complications of skin and subcutaneous tissue: Secondary | ICD-10-CM

## 2017-06-16 DIAGNOSIS — Z87898 Personal history of other specified conditions: Secondary | ICD-10-CM

## 2017-06-16 NOTE — Progress Notes (Signed)
  Postoperative Follow-up [facility] uterine bleeding, pelvic pain and cervical dysplasia, 6 weeks ago.  Subjective: Patient reports marked improvement in her preop symptoms. Eating a regular diet without difficulty. The patient is not having any pain.  Activity: normal activities of daily living. Patient reports vaginal sx's of None  Objective: BP 100/60   Pulse 69   Ht 5\' 7"  (1.702 m)   Wt 121 lb (54.9 kg)   LMP 02/25/2017   BMI 18.95 kg/m  Physical Exam  Constitutional: She is oriented to person, place, and time. She appears well-developed and well-nourished. No distress.  Genitourinary: Rectum normal and vagina normal. Pelvic exam was performed with patient supine. There is no rash, tenderness or lesion on the right labia. There is no rash, tenderness or lesion on the left labia. No erythema or bleeding in the vagina. Right adnexum does not display mass and does not display tenderness. Left adnexum does not display mass and does not display tenderness.  Genitourinary Comments: Cervix and uterus absent. Vaginal cuff healing well w small area of granulation tissue treated w AgNO3; no separation or dehiscence  Cardiovascular: Normal rate.  Pulmonary/Chest: Effort normal.  Abdominal: Soft. She exhibits no distension. There is no tenderness.  Incision healing well.  Musculoskeletal: Normal range of motion.  Neurological: She is alert and oriented to person, place, and time. No cranial nerve deficit.  Skin: Skin is warm and dry.  Psychiatric: She has a normal mood and affect.   Assessment: s/p :  total laparoscopic hysterectomy with bilateral salpingectomy stable  Plan: Patient has done well after surgery with no apparent complications.  I have discussed the post-operative course to date, and the expected progress moving forward.  The patient understands what complications to be concerned about.  I will see the patient in routine follow up, or  sooner if needed.    Activity plan: No restriction.  Courtney Rivers 06/16/2017, 5:02 PM

## 2017-06-19 ENCOUNTER — Encounter: Payer: Self-pay | Admitting: Obstetrics & Gynecology

## 2017-07-03 ENCOUNTER — Other Ambulatory Visit: Payer: Self-pay | Admitting: Unknown Physician Specialty

## 2017-07-03 MED ORDER — AMOXICILLIN 875 MG PO TABS: 875.0000 mg | ORAL_TABLET | Freq: Two times a day (BID) | ORAL | 0 refills | Status: DC

## 2017-07-14 ENCOUNTER — Telehealth: Payer: Self-pay | Admitting: Unknown Physician Specialty

## 2017-07-14 MED ORDER — FLUCONAZOLE 150 MG PO TABS: 150.0000 mg | ORAL_TABLET | Freq: Once | ORAL | 0 refills | Status: AC

## 2017-07-14 NOTE — Telephone Encounter (Signed)
[facility] [address]   Please Advise.  Thank you

## 2017-07-16 ENCOUNTER — Telehealth: Payer: Self-pay

## 2017-07-16 MED ORDER — FLUCONAZOLE 150 MG PO TABS: 150.0000 mg | ORAL_TABLET | Freq: Once | ORAL | 0 refills | Status: AC

## 2017-07-16 NOTE — Telephone Encounter (Signed)
Patient would like a prescription of Diflucan sent to [facility] [address]

## 2017-08-21 ENCOUNTER — Other Ambulatory Visit: Payer: Self-pay | Admitting: Family Medicine

## 2017-08-21 MED ORDER — ERYTHROMYCIN 5 MG/GM OP OINT: 1.0000 "application " | TOPICAL_OINTMENT | Freq: Four times a day (QID) | OPHTHALMIC | 0 refills | Status: DC

## 2017-08-25 ENCOUNTER — Encounter: Payer: Self-pay | Admitting: Family Medicine

## 2017-08-25 MED ORDER — BACITRACIN-POLYMYXIN B 500-10000 UNIT/GM OP OINT: 1.0000 "application " | TOPICAL_OINTMENT | Freq: Two times a day (BID) | OPHTHALMIC | 0 refills | Status: DC

## 2017-08-28 ENCOUNTER — Ambulatory Visit (INDEPENDENT_AMBULATORY_CARE_PROVIDER_SITE_OTHER): Payer: 59 | Admitting: Family Medicine

## 2017-08-28 ENCOUNTER — Encounter: Payer: Self-pay | Admitting: Family Medicine

## 2017-08-28 VITALS — BP 108/73 | HR 66 | Temp 98.1°F | Wt 122.4 lb

## 2017-08-28 DIAGNOSIS — F331 Major depressive disorder, recurrent, moderate: Secondary | ICD-10-CM | POA: Diagnosis not present

## 2017-08-28 MED ORDER — VENLAFAXINE HCL ER 75 MG PO CP24: 75.0000 mg | ORAL_CAPSULE | Freq: Every day | ORAL | 3 refills | Status: DC

## 2017-08-28 MED ORDER — ARIPIPRAZOLE 5 MG PO TABS: 5.0000 mg | ORAL_TABLET | Freq: Every day | ORAL | 3 refills | Status: DC

## 2017-08-28 NOTE — Assessment & Plan Note (Signed)
Not doing well. Will start abilify and effexor and recheck 1 month. Call with any concerns or if medicine isn't working.

## 2017-08-28 NOTE — Progress Notes (Signed)
BP 108/73 (BP Location: Left Arm, Patient Position: Sitting, Cuff Size: Normal)   Pulse 66   Temp 98.1 F (36.7 C)   Wt 122 lb 6 oz (55.5 kg)   LMP 02/25/2017   SpO2 99%   BMI 19.17 kg/m    Subjective:    Patient ID: Courtney Rivers, female    DOB: 03/08/1986, 32 y.o.   MRN: [id number]  HPI: Courtney Rivers is a 32 y.o. female  Chief Complaint  Patient presents with  . Depression  . Anxiety   DEPRESSION Mood status: exacerbated Satisfied with current treatment?: no Symptom severity: moderate  Duration of current treatment : Chronic, but not currently on anything Psychotherapy/counseling: no  Previous psychiatric medications: prozac- hallucinations, zoloft- didn't work took for 3 months, wellbutrin- weight loss, seroquel- helped with sleep only Depressed mood: yes Anxious mood: yes Anhedonia: no Significant weight loss or gain: no Insomnia: yes hard to fall asleep Fatigue: yes Feelings of worthlessness or guilt: yes Impaired concentration/indecisiveness: yes Suicidal ideations: no Hopelessness: yes Crying spells: yes [facility] 08/28/2017 02/16/2017 10/31/2016  Decreased Interest 3 1 2   Down, Depressed, Hopeless 3 2 2   PHQ - 2 Score 6 3 4   Altered sleeping 2 1 3   Tired, decreased energy 3 3 3   Change in appetite 1 3 3   Feeling bad or failure about yourself  2 1 1   Trouble concentrating 2 3 1   Moving slowly or fidgety/restless 3 0 0  Suicidal thoughts 0 0 0  PHQ-9 Score 19 14 15   Difficult doing work/chores - - Extremely dIfficult   GAD 7 : Generalized Anxiety Score 08/28/2017 02/16/2017 10/31/2016  Nervous, Anxious, on Edge 3 3 2   Control/stop worrying 3 2 2   Worry too much - different things 3 2 2   Trouble relaxing 3 2 2   Restless 3 1 2   Easily annoyed or irritable 3 3 3   Afraid - awful might happen 1 0 0  Total GAD 7 Score 19 13 13     Relevant past medical, surgical, family and social history reviewed and updated as indicated.  Interim medical history since our last visit reviewed. Allergies and medications reviewed and updated.  Review of Systems  Constitutional: Negative.   Respiratory: Negative.   Cardiovascular: Negative.   Neurological: Negative.   Psychiatric/Behavioral: Positive for agitation, confusion, decreased concentration, dysphoric mood and sleep disturbance. Negative for behavioral problems, hallucinations, self-injury and suicidal ideas. The patient is nervous/anxious. The patient is not hyperactive.     Per HPI unless specifically indicated above     Objective:    BP 108/73 (BP Location: Left Arm, Patient Position: Sitting, Cuff Size: Normal)   Pulse 66   Temp 98.1 F (36.7 C)   Wt 122 lb 6 oz (55.5 kg)   LMP 02/25/2017   SpO2 99%   BMI 19.17 kg/m   Wt Readings from Last 3 Encounters:  08/28/17 122 lb 6 oz (55.5 kg)  06/16/17 121 lb (54.9 kg)  05/25/17 118 lb (53.5 kg)    Physical Exam  Constitutional: She is oriented to person, place, and time. She appears well-developed and well-nourished. No distress.  HENT:  Head: Normocephalic and atraumatic.  Right Ear: Hearing normal.  Left Ear: Hearing normal.  Nose: Nose normal.  Eyes: Conjunctivae and lids are normal. Right eye exhibits no discharge. Left eye exhibits no discharge. No scleral icterus.  Cardiovascular: Normal rate, regular rhythm, normal heart sounds and intact distal pulses. Exam reveals no gallop and no  friction rub.  No murmur heard. Pulmonary/Chest: Effort normal and breath sounds normal. No respiratory distress. She has no wheezes. She has no rales. She exhibits no tenderness.  Musculoskeletal: Normal range of motion.  Neurological: She is alert and oriented to person, place, and time.  Skin: Skin is warm, dry and intact. No rash noted. She is not diaphoretic. No erythema. No pallor.  Psychiatric: Her speech is normal and behavior is normal. Judgment and thought content normal. Her mood appears anxious. Cognition  and memory are normal. She exhibits a depressed mood.  Nursing note and vitals reviewed.   Results for orders placed or performed during the hospital encounter of 04/16/17  Pregnancy, urine POC  Result Value Ref Range   Preg Test, Ur NEGATIVE NEGATIVE  Surgical pathology  Result Value Ref Range   SURGICAL PATHOLOGY      Surgical Pathology CASE: [id number] PATIENT: Adela Ports Surgical Pathology Report     SPECIMEN SUBMITTED: A. Uterus with cervix, bilateral tubes  CLINICAL HISTORY: None provided  PRE-OPERATIVE DIAGNOSIS: Menometrorrhagia  POST-OPERATIVE DIAGNOSIS: Same as pre-op     DIAGNOSIS: A. UTERUS WITH CERVIX AND BILATERAL FALLOPIAN TUBES; HYSTERECTOMY WITH BILATERAL SALPINGECTOMY: - LOW GRADE SQUAMOUS INTRAEPITHELIAL LESION (LSIL/CIN I). - SECRETORY ENDOMETRIUM. - PARATUBAL CYST OF THE LEFT FALLOPIAN TUBE. - UNREMARKABLE RIGHT FALLOPIAN TUBE.   GROSS DESCRIPTION:  A. Labeled: uterus with cervix, bilateral tubes  Adnexa: bilateral fallopian tubes  Weight of specimen: 91 grams  Size of specimen:      Height: 4.7 cm      Anterior to posterior width: 3.5 cm      Breadth at fundus: 5.8 cm  Serosa: inking-anterior=blue, posterior=black (most suggestive from peritoneal reflection)  Cervix:      Ectocervix: 3.7 x 4.1 cm, predominantly smooth a nd purple with slight granularity in what appears 10 to 3:00 aspect and a 1.1 cm slit like os             Tumor size: no tumor grossly identified      Endocervix: trabecular pink-tan  Other features: none noted  Endometrium:            Length of endometrial cavity: 3.1 cm      Width of endometrial cavity at fundus: 3.0 cm      Endometrial surface: 0.1 cm      Description: soft pink to red  Myometrium:      Thickness of wall: 1.7 cm      Description: trabecular pink-tan  Adnexa:       Right fallopian tube:           Measurements: 6.7 x 1.1 x 1.0 cm           Description: purple to tan  fimbriated discontinuous       Left fallopian tube:           Measurements: 8.9 x 1.5 x 1.1 cm           Description: purple to tan fimbriated discontinuous with a 0.4 cm paratubal cyst  Block summary: 1-12-entire cervix radially sectioned from 12:00 clockwise respectively 13-representative anterior endomyometrium 14-representative posterior endomyometrium 15-representative  cross-section right fallopian tube and longitudinal fimbriated end 16-representative cross-section and longitudinal fimbriated end left fallopian with paratubal cyst Final Diagnosis performed by Glenice Bow, MD.  Electronically signed 04/17/2017 12:20:31PM    The electronic signature indicates that the named Attending Pathologist has evaluated the specimen  Technical component performed at [facility], [address] [place], [place] [address] Lab: [phone]  Dir: Titus Dubin. Cato Mulligan, MD  Professional component performed at [facility], [facility], [address] [place], [place], [place] [address] Lab: [phone] Dir: Georgiann Cocker. Oneita Kras, MD        Assessment & Plan:   Problem List Items Addressed This Visit      Other   Moderate episode of recurrent major depressive disorder (HCC) - Primary    Not doing well. Will start abilify and effexor and recheck 1 month. Call with any concerns or if medicine isn't working.       Relevant Medications   venlafaxine XR (EFFEXOR XR) 75 MG 24 hr capsule       Follow up plan: Return in about 4 weeks (around 09/25/2017).

## 2017-09-07 ENCOUNTER — Encounter: Payer: Self-pay | Admitting: Family Medicine

## 2017-09-07 ENCOUNTER — Ambulatory Visit (INDEPENDENT_AMBULATORY_CARE_PROVIDER_SITE_OTHER): Payer: 59 | Admitting: Family Medicine

## 2017-09-07 VITALS — BP 118/79 | HR 82 | Temp 98.6°F | Wt 122.4 lb

## 2017-09-07 DIAGNOSIS — F331 Major depressive disorder, recurrent, moderate: Secondary | ICD-10-CM | POA: Diagnosis not present

## 2017-09-07 MED ORDER — PRAZOSIN HCL 1 MG PO CAPS: 1.0000 mg | ORAL_CAPSULE | Freq: Every day | ORAL | 3 refills | Status: DC

## 2017-09-07 NOTE — Progress Notes (Signed)
BP 118/79 (BP Location: Left Arm, Patient Position: Sitting, Cuff Size: Small)   Pulse 82   Temp 98.6 F (37 C)   Wt 122 lb 6 oz (55.5 kg)   LMP 02/25/2017   SpO2 100%   BMI 19.17 kg/m    Subjective:    Patient ID: Courtney Rivers, female    DOB: 12/18/1985, 32 y.o.   MRN: [id number]  HPI: Courtney Rivers is a 32 y.o. female  No chief complaint on file.  Not doing great. Not sure which medicine is giving her side effects. She notes that she is having horrible dreams when she finally falls asleep and that makes her wake up. She has been tired and yawning and dizzy and weak in the knees. Her vision has been getting blurry and she has been clenching her teeth at all times to the point that it hurts her teeth to eat anything. She has also been nauseous and burping a lot. She has been having acid reflux and burning in her chest. She is otherwise doing well with no other concerns or complaints at this time.   Relevant past medical, surgical, family and social history reviewed and updated as indicated. Interim medical history since our last visit reviewed. Allergies and medications reviewed and updated.  Review of Systems  Constitutional: Negative.   Respiratory: Negative.   Cardiovascular: Negative.   Psychiatric/Behavioral: Positive for confusion, decreased concentration, dysphoric mood and sleep disturbance. Negative for agitation, behavioral problems, hallucinations, self-injury and suicidal ideas. The patient is nervous/anxious. The patient is not hyperactive.     Per HPI unless specifically indicated above     Objective:    BP 118/79 (BP Location: Left Arm, Patient Position: Sitting, Cuff Size: Small)   Pulse 82   Temp 98.6 F (37 C)   Wt 122 lb 6 oz (55.5 kg)   LMP 02/25/2017   SpO2 100%   BMI 19.17 kg/m   Wt Readings from Last 3 Encounters:  09/07/17 122 lb 6 oz (55.5 kg)  08/28/17 122 lb 6 oz (55.5 kg)  06/16/17 121 lb (54.9 kg)    Physical Exam    Constitutional: She is oriented to person, place, and time. She appears well-developed and well-nourished. No distress.  HENT:  Head: Normocephalic and atraumatic.  Right Ear: Hearing normal.  Left Ear: Hearing normal.  Nose: Nose normal.  Eyes: Conjunctivae and lids are normal. Right eye exhibits no discharge. Left eye exhibits no discharge. No scleral icterus.  Cardiovascular: Normal rate, regular rhythm, normal heart sounds and intact distal pulses. Exam reveals no gallop and no friction rub.  No murmur heard. Pulmonary/Chest: Effort normal and breath sounds normal. No respiratory distress. She has no wheezes. She has no rales. She exhibits no tenderness.  Musculoskeletal: Normal range of motion.  Neurological: She is alert and oriented to person, place, and time.  Skin: Skin is warm, dry and intact. No rash noted. She is not diaphoretic. No erythema. No pallor.  Psychiatric: She has a normal mood and affect. Her speech is normal and behavior is normal. Judgment and thought content normal. Cognition and memory are normal.  Nursing note and vitals reviewed.   Results for orders placed or performed during the hospital encounter of 04/16/17  Pregnancy, urine POC  Result Value Ref Range   Preg Test, Ur NEGATIVE NEGATIVE  Surgical pathology  Result Value Ref Range   SURGICAL PATHOLOGY      Surgical Pathology CASE: [id number] PATIENT: Adela PortsHRISTAN Rivers Surgical Pathology Report  SPECIMEN SUBMITTED: A. Uterus with cervix, bilateral tubes  CLINICAL HISTORY: None provided  PRE-OPERATIVE DIAGNOSIS: Menometrorrhagia  POST-OPERATIVE DIAGNOSIS: Same as pre-op     DIAGNOSIS: A. UTERUS WITH CERVIX AND BILATERAL FALLOPIAN TUBES; HYSTERECTOMY WITH BILATERAL SALPINGECTOMY: - LOW GRADE SQUAMOUS INTRAEPITHELIAL LESION (LSIL/CIN I). - SECRETORY ENDOMETRIUM. - PARATUBAL CYST OF THE LEFT FALLOPIAN TUBE. - UNREMARKABLE RIGHT FALLOPIAN TUBE.   GROSS DESCRIPTION:  A.  Labeled: uterus with cervix, bilateral tubes  Adnexa: bilateral fallopian tubes  Weight of specimen: 91 grams  Size of specimen:      Height: 4.7 cm      Anterior to posterior width: 3.5 cm      Breadth at fundus: 5.8 cm  Serosa: inking-anterior=blue, posterior=black (most suggestive from peritoneal reflection)  Cervix:      Ectocervix: 3.7 x 4.1 cm, predominantly smooth a nd purple with slight granularity in what appears 10 to 3:00 aspect and a 1.1 cm slit like os             Tumor size: no tumor grossly identified      Endocervix: trabecular pink-tan  Other features: none noted  Endometrium:            Length of endometrial cavity: 3.1 cm      Width of endometrial cavity at fundus: 3.0 cm      Endometrial surface: 0.1 cm      Description: soft pink to red  Myometrium:      Thickness of wall: 1.7 cm      Description: trabecular pink-tan  Adnexa:       Right fallopian tube:           Measurements: 6.7 x 1.1 x 1.0 cm           Description: purple to tan fimbriated discontinuous       Left fallopian tube:           Measurements: 8.9 x 1.5 x 1.1 cm           Description: purple to tan fimbriated discontinuous with a 0.4 cm paratubal cyst  Block summary: 1-12-entire cervix radially sectioned from 12:00 clockwise respectively 13-representative anterior endomyometrium 14-representative posterior endomyometrium 15-representative  cross-section right fallopian tube and longitudinal fimbriated end 16-representative cross-section and longitudinal fimbriated end left fallopian with paratubal cyst Final Diagnosis performed by Glenice Bow, MD.  Electronically signed 04/17/2017 12:20:31PM    The electronic signature indicates that the named Attending Pathologist has evaluated the specimen  Technical component performed at [facility], [address], [place], [place] [address] Lab: [phone] Dir: Titus Dubin. Cato Mulligan, MD  Professional component performed at [facility], [facility], [address] [place], [place], [place] [address] Lab: [phone] Dir: Georgiann Cocker. Rubinas, MD        Assessment & Plan:   Problem List Items Addressed This Visit      Other   Moderate episode of recurrent major depressive disorder (HCC) - Primary    Will stop abilify for 1 week and start prazosin and see how she's doing. Continue effexor. Call with any concerns.           Follow up plan: Return for As scheduled.

## 2017-09-07 NOTE — Assessment & Plan Note (Signed)
Will stop abilify for 1 week and start prazosin and see how she's doing. Continue effexor. Call with any concerns.

## 2017-09-15 ENCOUNTER — Other Ambulatory Visit: Payer: Self-pay | Admitting: Family Medicine

## 2017-09-15 MED ORDER — OSELTAMIVIR PHOSPHATE 75 MG PO CAPS: 75.0000 mg | ORAL_CAPSULE | Freq: Every day | ORAL | 0 refills | Status: DC

## 2017-10-05 ENCOUNTER — Ambulatory Visit (INDEPENDENT_AMBULATORY_CARE_PROVIDER_SITE_OTHER): Payer: 59 | Admitting: Family Medicine

## 2017-10-05 ENCOUNTER — Encounter: Payer: Self-pay | Admitting: Family Medicine

## 2017-10-05 DIAGNOSIS — F331 Major depressive disorder, recurrent, moderate: Secondary | ICD-10-CM | POA: Diagnosis not present

## 2017-10-05 MED ORDER — QUETIAPINE FUMARATE ER 50 MG PO TB24: 50.0000 mg | ORAL_TABLET | Freq: Every day | ORAL | 3 refills | Status: DC

## 2017-10-05 MED ORDER — PRAZOSIN HCL 1 MG PO CAPS: 1.0000 mg | ORAL_CAPSULE | Freq: Every day | ORAL | 3 refills | Status: DC

## 2017-10-05 MED ORDER — VENLAFAXINE HCL ER 75 MG PO CP24: 75.0000 mg | ORAL_CAPSULE | Freq: Two times a day (BID) | ORAL | 3 refills | Status: DC

## 2017-10-05 NOTE — Progress Notes (Signed)
BP 111/72 (BP Location: Right Arm, Patient Position: Sitting, Cuff Size: Normal)   Pulse 78   Temp 98.3 F (36.8 C)   Wt 125 lb 3 oz (56.8 kg)   LMP 02/25/2017   SpO2 100%   BMI 19.61 kg/m    Subjective:    Patient ID: Courtney GivenChristan M Rivers, female    DOB: 09/25/1985, 32 y.o.   MRN: [id number]  HPI: Courtney Rivers is a 32 y.o. female  Chief Complaint  Patient presents with  . Anxiety    patient states that she feels as if the venlafaxine wears off at about 2 pm, she would like to restart the seroquel.    ANXIETY/STRESS Duration:better Anxious mood: yes  Excessive worrying: yes Irritability: yes  Sweating: no Nausea: no Palpitations:no Hyperventilation: no Panic attacks: no Agoraphobia: no  Obscessions/compulsions: no Depressed mood: yes [facility] 10/05/2017 09/07/2017 08/28/2017 02/16/2017 10/31/2016  Decreased Interest 0 2 3 1 2   Down, Depressed, Hopeless 1 2 3 2 2   PHQ - 2 Score 1 4 6 3 4   Altered sleeping 1 3 2 1 3   Tired, decreased energy 1 3 3 3 3   Change in appetite 0 3 1 3 3   Feeling bad or failure about yourself  1 1 2 1 1   Trouble concentrating 0 3 2 3 1   Moving slowly or fidgety/restless 1 2 3  0 0  Suicidal thoughts 0 2 0 0 0  PHQ-9 Score 5 21 19 14 15   Difficult doing work/chores - - - - Extremely dIfficult   Anhedonia: no Weight changes: no Insomnia: no   Hypersomnia: no Fatigue/loss of energy: yes Feelings of worthlessness: no Feelings of guilt: no Impaired concentration/indecisiveness: no Suicidal ideations: no  Crying spells: no Recent Stressors/Life Changes: no  Relevant past medical, surgical, family and social history reviewed and updated as indicated. Interim medical history since our last visit reviewed. Allergies and medications reviewed and updated.  Review of Systems  Constitutional: Negative.   Respiratory: Negative.   Cardiovascular: Negative.   Psychiatric/Behavioral: Positive for behavioral problems and  dysphoric mood. Negative for agitation, confusion, decreased concentration, hallucinations, self-injury, sleep disturbance and suicidal ideas. The patient is nervous/anxious. The patient is not hyperactive.     Per HPI unless specifically indicated above     Objective:    BP 111/72 (BP Location: Right Arm, Patient Position: Sitting, Cuff Size: Normal)   Pulse 78   Temp 98.3 F (36.8 C)   Wt 125 lb 3 oz (56.8 kg)   LMP 02/25/2017   SpO2 100%   BMI 19.61 kg/m   Wt Readings from Last 3 Encounters:  10/05/17 125 lb 3 oz (56.8 kg)  09/07/17 122 lb 6 oz (55.5 kg)  08/28/17 122 lb 6 oz (55.5 kg)    Physical Exam  Constitutional: She is oriented to person, place, and time. She appears well-developed and well-nourished. No distress.  HENT:  Head: Normocephalic and atraumatic.  Right Ear: Hearing normal.  Left Ear: Hearing normal.  Nose: Nose normal.  Eyes: Conjunctivae and lids are normal. Right eye exhibits no discharge. Left eye exhibits no discharge. No scleral icterus.  Cardiovascular: Normal rate, regular rhythm, normal heart sounds and intact distal pulses. Exam reveals no gallop and no friction rub.  No murmur heard. Pulmonary/Chest: Effort normal and breath sounds normal. No respiratory distress. She has no wheezes. She has no rales. She exhibits no tenderness.  Musculoskeletal: Normal range of motion.  Neurological: She is alert and oriented  to person, place, and time.  Skin: Skin is warm, dry and intact. No rash noted. She is not diaphoretic. No erythema. No pallor.  Psychiatric: She has a normal mood and affect. Her speech is normal and behavior is normal. Judgment and thought content normal. Cognition and memory are normal.  Nursing note and vitals reviewed.   Results for orders placed or performed during the hospital encounter of 04/16/17  Pregnancy, urine POC  Result Value Ref Range   Preg Test, Ur NEGATIVE NEGATIVE  Surgical pathology  Result Value Ref Range    SURGICAL PATHOLOGY      Surgical Pathology CASE: [id number] PATIENT: Adela Ports Surgical Pathology Report     SPECIMEN SUBMITTED: A. Uterus with cervix, bilateral tubes  CLINICAL HISTORY: None provided  PRE-OPERATIVE DIAGNOSIS: Menometrorrhagia  POST-OPERATIVE DIAGNOSIS: Same as pre-op     DIAGNOSIS: A. UTERUS WITH CERVIX AND BILATERAL FALLOPIAN TUBES; HYSTERECTOMY WITH BILATERAL SALPINGECTOMY: - LOW GRADE SQUAMOUS INTRAEPITHELIAL LESION (LSIL/CIN I). - SECRETORY ENDOMETRIUM. - PARATUBAL CYST OF THE LEFT FALLOPIAN TUBE. - UNREMARKABLE RIGHT FALLOPIAN TUBE.   GROSS DESCRIPTION:  A. Labeled: uterus with cervix, bilateral tubes  Adnexa: bilateral fallopian tubes  Weight of specimen: 91 grams  Size of specimen:      Height: 4.7 cm      Anterior to posterior width: 3.5 cm      Breadth at fundus: 5.8 cm  Serosa: inking-anterior=blue, posterior=black (most suggestive from peritoneal reflection)  Cervix:      Ectocervix: 3.7 x 4.1 cm, predominantly smooth a nd purple with slight granularity in what appears 10 to 3:00 aspect and a 1.1 cm slit like os             Tumor size: no tumor grossly identified      Endocervix: trabecular pink-tan  Other features: none noted  Endometrium:            Length of endometrial cavity: 3.1 cm      Width of endometrial cavity at fundus: 3.0 cm      Endometrial surface: 0.1 cm      Description: soft pink to red  Myometrium:      Thickness of wall: 1.7 cm      Description: trabecular pink-tan  Adnexa:       Right fallopian tube:           Measurements: 6.7 x 1.1 x 1.0 cm           Description: purple to tan fimbriated discontinuous       Left fallopian tube:           Measurements: 8.9 x 1.5 x 1.1 cm           Description: purple to tan fimbriated discontinuous with a 0.4 cm paratubal cyst  Block summary: 1-12-entire cervix radially sectioned from 12:00 clockwise respectively 13-representative anterior  endomyometrium 14-representative posterior endomyometrium 15-representative  cross-section right fallopian tube and longitudinal fimbriated end 16-representative cross-section and longitudinal fimbriated end left fallopian with paratubal cyst Final Diagnosis performed by Glenice Bow, MD.  Electronically signed 04/17/2017 12:20:31PM    The electronic signature indicates that the named Attending Pathologist has evaluated the specimen  Technical component performed at [facility], [address] [place], [place] [address] Lab: [phone] Dir: Titus Dubin. Cato Mulligan, MD  Professional component performed at [facility], [facility], [address] [place], [place], [place] [address] Lab: [phone] Dir: Georgiann Cocker. Oneita Kras, MD        Assessment & Plan:   Problem List  Items Addressed This Visit      Other   Moderate episode of recurrent major depressive disorder (HCC)    Will change back to seroquel and try the long acting. Effexor is wearing off early. Will change it to BID and recheck 6-8 weeks. Will continue prazosin. Working well. Refills Rivers today.      Relevant Medications   venlafaxine XR (EFFEXOR XR) 75 MG 24 hr capsule       Follow up plan: Return 6-8 weeks.

## 2017-10-05 NOTE — Assessment & Plan Note (Signed)
Will change back to seroquel and try the long acting. Effexor is wearing off early. Will change it to BID and recheck 6-8 weeks. Will continue prazosin. Working well. Refills given today.

## 2017-11-09 ENCOUNTER — Encounter: Payer: Self-pay | Admitting: Family Medicine

## 2017-11-09 ENCOUNTER — Ambulatory Visit (INDEPENDENT_AMBULATORY_CARE_PROVIDER_SITE_OTHER): Payer: 59 | Admitting: Family Medicine

## 2017-11-09 VITALS — BP 102/70 | HR 94 | Temp 98.6°F | Ht 68.0 in | Wt 124.7 lb

## 2017-11-09 DIAGNOSIS — J301 Allergic rhinitis due to pollen: Secondary | ICD-10-CM

## 2017-11-09 DIAGNOSIS — J0100 Acute maxillary sinusitis, unspecified: Secondary | ICD-10-CM | POA: Diagnosis not present

## 2017-11-09 MED ORDER — MONTELUKAST SODIUM 10 MG PO TABS: 10.0000 mg | ORAL_TABLET | Freq: Every day | ORAL | 1 refills | Status: DC

## 2017-11-09 MED ORDER — DOXYCYCLINE HYCLATE 100 MG PO TABS: 100.0000 mg | ORAL_TABLET | Freq: Two times a day (BID) | ORAL | 0 refills | Status: DC

## 2017-11-09 NOTE — Progress Notes (Signed)
BP 102/70 (BP Location: Left Arm, Patient Position: Sitting, Cuff Size: Normal)   Pulse 94   Temp 98.6 F (37 C) (Oral)   Ht 5\' 8"  (1.727 m)   Wt 124 lb 11.2 oz (56.6 kg)   LMP 02/25/2017   SpO2 95%   BMI 18.96 kg/m    Subjective:    Patient ID: Courtney Rivers, female    DOB: 1986/01/13, 32 y.o.   MRN: [id number]  HPI: Courtney Rivers is a 32 y.o. female  Chief Complaint  Patient presents with  . Nasal Congestion    Symptoms ongoing for 1-2 weeks.   Marland Kitchen Headache  . Sinusitis  . Sore Throat  . Chills   UPPER RESPIRATORY TRACT INFECTION Duration: 1-2 weeks Worst symptom: Congestion Fever: no Cough: yes Shortness of breath: no Wheezing: no Chest pain: no Chest tightness: no Chest congestion: no Nasal congestion: yes Runny nose: yes Post nasal drip: yes Sneezing: yes Sore throat: yes Swollen glands: no Sinus pressure: yes Headache: yes Face pain: yes Toothache: yes Ear pain: yes bilateral Ear pressure: yes bilateral Eyes red/itching:no Eye drainage/crusting: no  Vomiting: yes Rash: no Fatigue: yes Sick contacts: yes Strep contacts: no  Context: worse Recurrent sinusitis: no Relief with OTC cold/cough medications: no  Treatments attempted: mucinex and pseudoephedrine    Relevant past medical, surgical, family and social history reviewed and updated as indicated. Interim medical history since our last visit reviewed. Allergies and medications reviewed and updated.  Review of Systems  Constitutional: Negative.   HENT: Positive for congestion, postnasal drip, rhinorrhea, sinus pressure, sinus pain and sneezing. Negative for dental problem, drooling, ear discharge, ear pain, facial swelling, hearing loss, mouth sores, nosebleeds, sore throat, tinnitus, trouble swallowing and voice change.   Respiratory: Positive for cough. Negative for apnea, choking, chest tightness, shortness of breath, wheezing and stridor.   Cardiovascular: Negative.     Psychiatric/Behavioral: Negative.     Per HPI unless specifically indicated above     Objective:    BP 102/70 (BP Location: Left Arm, Patient Position: Sitting, Cuff Size: Normal)   Pulse 94   Temp 98.6 F (37 C) (Oral)   Ht 5\' 8"  (1.727 m)   Wt 124 lb 11.2 oz (56.6 kg)   LMP 02/25/2017   SpO2 95%   BMI 18.96 kg/m   Wt Readings from Last 3 Encounters:  11/09/17 124 lb 11.2 oz (56.6 kg)  10/05/17 125 lb 3 oz (56.8 kg)  09/07/17 122 lb 6 oz (55.5 kg)    Physical Exam  Constitutional: She is oriented to person, place, and time. She appears well-developed and well-nourished. No distress.  HENT:  Head: Normocephalic and atraumatic.  Right Ear: Hearing, tympanic membrane, external ear and ear canal normal.  Left Ear: Hearing, tympanic membrane, external ear and ear canal normal.  Nose: Mucosal edema and rhinorrhea present. Right sinus exhibits maxillary sinus tenderness. Right sinus exhibits no frontal sinus tenderness. Left sinus exhibits maxillary sinus tenderness. Left sinus exhibits no frontal sinus tenderness.  Mouth/Throat: Uvula is midline, oropharynx is clear and moist and mucous membranes are normal. No oropharyngeal exudate.  Eyes: Pupils are equal, round, and reactive to light. Conjunctivae, EOM and lids are normal. Right eye exhibits no discharge. Left eye exhibits no discharge. No scleral icterus.  Neck: Normal range of motion. Neck supple. No JVD present. No tracheal deviation present. No thyromegaly present.  Cardiovascular: Normal rate, regular rhythm, normal heart sounds and intact distal pulses. Exam reveals no gallop and  no friction rub.  No murmur heard. Pulmonary/Chest: Effort normal and breath sounds normal. No stridor. No respiratory distress. She has no wheezes. She has no rales. She exhibits no tenderness.  Musculoskeletal: Normal range of motion.  Lymphadenopathy:    She has no cervical adenopathy.  Neurological: She is alert and oriented to person, place,  and time.  Skin: Skin is warm, dry and intact. No rash noted. She is not diaphoretic. No erythema. No pallor.  Psychiatric: She has a normal mood and affect. Her speech is normal and behavior is normal. Judgment and thought content normal. Cognition and memory are normal.    Results for orders placed or performed during the hospital encounter of 04/16/17  Pregnancy, urine POC  Result Value Ref Range   Preg Test, Ur NEGATIVE NEGATIVE  Surgical pathology  Result Value Ref Range   SURGICAL PATHOLOGY      Surgical Pathology CASE: [id number] PATIENT: Adela PortsHRISTAN Bole Surgical Pathology Report     SPECIMEN SUBMITTED: A. Uterus with cervix, bilateral tubes  CLINICAL HISTORY: None provided  PRE-OPERATIVE DIAGNOSIS: Menometrorrhagia  POST-OPERATIVE DIAGNOSIS: Same as pre-op     DIAGNOSIS: A. UTERUS WITH CERVIX AND BILATERAL FALLOPIAN TUBES; HYSTERECTOMY WITH BILATERAL SALPINGECTOMY: - LOW GRADE SQUAMOUS INTRAEPITHELIAL LESION (LSIL/CIN I). - SECRETORY ENDOMETRIUM. - PARATUBAL CYST OF THE LEFT FALLOPIAN TUBE. - UNREMARKABLE RIGHT FALLOPIAN TUBE.   GROSS DESCRIPTION:  A. Labeled: uterus with cervix, bilateral tubes  Adnexa: bilateral fallopian tubes  Weight of specimen: 91 grams  Size of specimen:      Height: 4.7 cm      Anterior to posterior width: 3.5 cm      Breadth at fundus: 5.8 cm  Serosa: inking-anterior=blue, posterior=black (most suggestive from peritoneal reflection)  Cervix:      Ectocervix: 3.7 x 4.1 cm, predominantly smooth a nd purple with slight granularity in what appears 10 to 3:00 aspect and a 1.1 cm slit like os             Tumor size: no tumor grossly identified      Endocervix: trabecular pink-tan  Other features: none noted  Endometrium:            Length of endometrial cavity: 3.1 cm      Width of endometrial cavity at fundus: 3.0 cm      Endometrial surface: 0.1 cm      Description: soft pink to red  Myometrium:       Thickness of wall: 1.7 cm      Description: trabecular pink-tan  Adnexa:       Right fallopian tube:           Measurements: 6.7 x 1.1 x 1.0 cm           Description: purple to tan fimbriated discontinuous       Left fallopian tube:           Measurements: 8.9 x 1.5 x 1.1 cm           Description: purple to tan fimbriated discontinuous with a 0.4 cm paratubal cyst  Block summary: 1-12-entire cervix radially sectioned from 12:00 clockwise respectively 13-representative anterior endomyometrium 14-representative posterior endomyometrium 15-representative  cross-section right fallopian tube and longitudinal fimbriated end 16-representative cross-section and longitudinal fimbriated end left fallopian with paratubal cyst Final Diagnosis performed by Glenice Bowana Baker, MD.  Electronically signed 04/17/2017 12:20:31PM    The electronic signature indicates that the named Attending Pathologist has evaluated the specimen  Technical component performed at [facility], [address], [place], [place] [address] Lab: [phone] Dir: Titus Dubin. Cato Mulligan, MD  Professional component performed at [facility], [facility], [address] [place], [place], [place] [address] Lab: [phone] Dir: Georgiann Cocker. Oneita Kras, MD        Assessment & Plan:   Problem List Items Addressed This Visit      Respiratory   Rhinitis, allergic    Will start singulair. Call with any concerns.        Other Visit Diagnoses    Acute non-recurrent maxillary sinusitis    -  Primary   Will treat with doxycycline. Call with any concerns or if not getting better.    Relevant Medications   doxycycline (VIBRA-TABS) 100 MG tablet       Follow up plan: Return if symptoms worsen or fail to improve.

## 2017-11-09 NOTE — Assessment & Plan Note (Signed)
Will start singulair. Call with any concerns.

## 2017-11-12 ENCOUNTER — Telehealth: Payer: Self-pay | Admitting: Family Medicine

## 2017-11-12 MED ORDER — ONDANSETRON 4 MG PO TBDP: 4.0000 mg | ORAL_TABLET | Freq: Three times a day (TID) | ORAL | 3 refills | Status: DC | PRN

## 2017-11-12 NOTE — Telephone Encounter (Signed)
[facility] [facility] and would like to know if she can take it more often if so could you put that on the refill please.  Thank You

## 2017-11-12 NOTE — Telephone Encounter (Signed)
All set!

## 2017-11-13 ENCOUNTER — Ambulatory Visit: Payer: 59 | Admitting: Family Medicine

## 2017-11-20 ENCOUNTER — Telehealth: Payer: Self-pay | Admitting: Family Medicine

## 2017-11-20 MED ORDER — MUPIROCIN 2 % EX OINT: 1.0000 "application " | TOPICAL_OINTMENT | Freq: Two times a day (BID) | CUTANEOUS | 0 refills | Status: DC

## 2017-11-20 NOTE — Telephone Encounter (Signed)
Patient stated that she has a deep sore in both nostrils and would like to know if there is anything she can take for it. Patient's mother is concerned that is it [place].   Please Advise.  Thank you

## 2017-11-20 NOTE — Telephone Encounter (Signed)
bactroban sent to her pharmacy

## 2017-12-02 ENCOUNTER — Ambulatory Visit (INDEPENDENT_AMBULATORY_CARE_PROVIDER_SITE_OTHER): Payer: 59 | Admitting: Family Medicine

## 2017-12-02 ENCOUNTER — Encounter: Payer: Self-pay | Admitting: Family Medicine

## 2017-12-02 VITALS — BP 112/79 | HR 69 | Temp 98.3°F | Wt 126.0 lb

## 2017-12-02 DIAGNOSIS — F331 Major depressive disorder, recurrent, moderate: Secondary | ICD-10-CM

## 2017-12-02 MED ORDER — QUETIAPINE FUMARATE ER 50 MG PO TB24: 50.0000 mg | ORAL_TABLET | Freq: Every day | ORAL | 3 refills | Status: DC

## 2017-12-02 MED ORDER — VENLAFAXINE HCL ER 75 MG PO CP24: ORAL_CAPSULE | ORAL | 3 refills | Status: DC

## 2017-12-02 NOTE — Assessment & Plan Note (Signed)
Not doing great. Lots of stress at work. Will increase her effexor to 225mg  daily- to be taken how she can to avoid GI upset and will increase seroquel that she can take 100mg  if having a bad day. Keep prazosin the same. Refills given. Call with any concerns.

## 2017-12-02 NOTE — Progress Notes (Signed)
BP 112/79 (BP Location: Left Arm, Patient Position: Sitting, Cuff Size: Normal)   Pulse 69   Temp 98.3 F (36.8 C)   Wt 126 lb (57.2 kg)   LMP 02/25/2017   SpO2 100%   BMI 19.16 kg/m    Subjective:    Patient ID: Courtney Rivers, female    DOB: 08-Oct-1985, 32 y.o.   MRN: [id number]  HPI: Courtney Rivers is a 32 y.o. female  Chief Complaint  Patient presents with  . Depression   DEPRESSION Mood status: exacerbated Satisfied with current treatment?: no Symptom severity: moderate  Duration of current treatment : months Side effects: no Medication compliance: excellent compliance Psychotherapy/counseling: no  Depressed mood: yes Anxious mood: yes Anhedonia: no Significant weight loss or gain: yes Insomnia: yes Fatigue: yes Feelings of worthlessness or guilt: yes Impaired concentration/indecisiveness: no Suicidal ideations: no Hopelessness: no Crying spells: yes Depression screen [facility] 2/9 12/02/2017 12/02/2017 10/05/2017 09/07/2017 08/28/2017  Decreased Interest 1 1 0 2 3  Down, Depressed, Hopeless 1 1 1 2 3   PHQ - 2 Score 2 2 1 4 6   Altered sleeping 1 1 1 3 2   Tired, decreased energy 0 0 1 3 3   Change in appetite 1 1 0 3 1  Feeling bad or failure about yourself  - 0 1 1 2   Trouble concentrating 0 0 0 3 2  Moving slowly or fidgety/restless 0 0 1 2 3   Suicidal thoughts 0 0 0 2 0  PHQ-9 Score 4 4 5 21 19   Difficult doing work/chores Extremely dIfficult - - - -   GAD 7 : Generalized Anxiety Score 12/02/2017 12/02/2017 10/05/2017 09/07/2017  Nervous, Anxious, on Edge 3 3 1 3   Control/stop worrying 0 0 1 3  Worry too much - different things 3 3 1 3   Trouble relaxing 1 1 1 3   Restless 0 0 1 3  Easily annoyed or irritable 3 3 1 3   Afraid - awful might happen 3 3 1 1   Total GAD 7 Score 13 13 7 19   Anxiety Difficulty Extremely difficult Extremely difficult - -   Relevant past medical, surgical, family and social history reviewed and updated as indicated. Interim  medical history since our last visit reviewed. Allergies and medications reviewed and updated.  Review of Systems  Constitutional: Negative.   Respiratory: Negative.   Cardiovascular: Negative.   Neurological: Negative.   Psychiatric/Behavioral: Positive for agitation, dysphoric mood and sleep disturbance. Negative for behavioral problems, confusion, decreased concentration, hallucinations, self-injury and suicidal ideas. The patient is nervous/anxious. The patient is not hyperactive.     Per HPI unless specifically indicated above     Objective:    BP 112/79 (BP Location: Left Arm, Patient Position: Sitting, Cuff Size: Normal)   Pulse 69   Temp 98.3 F (36.8 C)   Wt 126 lb (57.2 kg)   LMP 02/25/2017   SpO2 100%   BMI 19.16 kg/m   Wt Readings from Last 3 Encounters:  12/02/17 126 lb (57.2 kg)  11/09/17 124 lb 11.2 oz (56.6 kg)  10/05/17 125 lb 3 oz (56.8 kg)    Physical Exam  Constitutional: She is oriented to person, place, and time. She appears well-developed and well-nourished. No distress.  HENT:  Head: Normocephalic and atraumatic.  Right Ear: Hearing normal.  Left Ear: Hearing normal.  Nose: Nose normal.  Eyes: Conjunctivae and lids are normal. Right eye exhibits no discharge. Left eye exhibits no discharge. No scleral icterus.  Cardiovascular: Normal  rate, regular rhythm, normal heart sounds and intact distal pulses. Exam reveals no gallop and no friction rub.  No murmur heard. Pulmonary/Chest: Effort normal and breath sounds normal. No stridor. No respiratory distress. She has no wheezes. She has no rales. She exhibits no tenderness.  Musculoskeletal: Normal range of motion.  Neurological: She is alert and oriented to person, place, and time.  Skin: Skin is warm, dry and intact. Capillary refill takes less than 2 seconds. No rash noted. She is not diaphoretic. No erythema. No pallor.  Psychiatric: Her speech is normal and behavior is normal. Judgment and thought  content normal. Her mood appears anxious. Cognition and memory are normal. She exhibits a depressed mood.  Nursing note and vitals reviewed.      Assessment & Plan:   Problem List Items Addressed This Visit      Other   Moderate episode of recurrent major depressive disorder (HCC) - Primary    Not doing great. Lots of stress at work. Will increase her effexor to 225mg  daily- to be taken how she can to avoid GI upset and will increase seroquel that she can take 100mg  if having a bad day. Keep prazosin the same. Refills given. Call with any concerns.       Relevant Medications   venlafaxine XR (EFFEXOR XR) 75 MG 24 hr capsule       Follow up plan: Return 4-6 weeks, for follow up mood.

## 2017-12-08 ENCOUNTER — Other Ambulatory Visit: Payer: 59

## 2017-12-08 ENCOUNTER — Other Ambulatory Visit: Payer: Self-pay | Admitting: Family Medicine

## 2017-12-08 DIAGNOSIS — Z113 Encounter for screening for infections with a predominantly sexual mode of transmission: Secondary | ICD-10-CM

## 2017-12-09 LAB — HEPATITIS PANEL, ACUTE
HEP A IGM: NEGATIVE
HEP B S AG: NEGATIVE
Hep B C IgM: NEGATIVE
Hep C Virus Ab: <0.1 s/co ratio (ref 0.0–0.9)

## 2017-12-09 LAB — HIV ANTIBODY (ROUTINE TESTING W REFLEX): HIV Screen 4th Generation wRfx: NONREACTIVE

## 2017-12-09 LAB — HSV(HERPES SIMPLEX VRS) I + II AB-IGG: HSV 1 Glycoprotein G Ab, IgG: 29.1 index — ABNORMAL HIGH (ref 0.00–0.90)

## 2017-12-09 LAB — RPR: RPR: NONREACTIVE

## 2017-12-10 LAB — GC/CHLAMYDIA PROBE AMP
CHLAMYDIA, DNA PROBE: NEGATIVE
Neisseria gonorrhoeae by PCR: NEGATIVE

## 2018-02-02 ENCOUNTER — Encounter: Payer: Self-pay | Admitting: Family Medicine

## 2018-02-02 ENCOUNTER — Ambulatory Visit (INDEPENDENT_AMBULATORY_CARE_PROVIDER_SITE_OTHER): Payer: 59 | Admitting: Family Medicine

## 2018-02-02 VITALS — BP 110/73 | HR 75 | Ht 68.5 in | Wt 123.0 lb

## 2018-02-02 DIAGNOSIS — H538 Other visual disturbances: Secondary | ICD-10-CM | POA: Diagnosis not present

## 2018-02-02 DIAGNOSIS — F331 Major depressive disorder, recurrent, moderate: Secondary | ICD-10-CM

## 2018-02-02 DIAGNOSIS — Z0001 Encounter for general adult medical examination with abnormal findings: Secondary | ICD-10-CM

## 2018-02-02 DIAGNOSIS — Z0000 Encounter for general adult medical examination without abnormal findings: Secondary | ICD-10-CM

## 2018-02-02 DIAGNOSIS — Z23 Encounter for immunization: Secondary | ICD-10-CM | POA: Diagnosis not present

## 2018-02-02 LAB — UA/M W/RFLX CULTURE, ROUTINE
BILIRUBIN UA: NEGATIVE
GLUCOSE, UA: NEGATIVE
KETONES UA: NEGATIVE
Leukocytes, UA: NEGATIVE
Nitrite, UA: NEGATIVE
PROTEIN UA: NEGATIVE
RBC, UA: NEGATIVE
SPEC GRAV UA: 1.015 (ref 1.005–1.030)
UUROB: 1 mg/dL (ref 0.2–1.0)
pH, UA: 7 (ref 5.0–7.5)

## 2018-02-02 MED ORDER — PRAZOSIN HCL 1 MG PO CAPS: 1.0000 mg | ORAL_CAPSULE | Freq: Every day | ORAL | 3 refills | Status: DC

## 2018-02-02 MED ORDER — QUETIAPINE FUMARATE ER 50 MG PO TB24: 50.0000 mg | ORAL_TABLET | Freq: Every day | ORAL | 1 refills | Status: DC

## 2018-02-02 MED ORDER — VENLAFAXINE HCL ER 75 MG PO CP24: ORAL_CAPSULE | ORAL | 1 refills | Status: DC

## 2018-02-02 MED ORDER — ONDANSETRON 4 MG PO TBDP: 4.0000 mg | ORAL_TABLET | Freq: Three times a day (TID) | ORAL | 3 refills | Status: DC | PRN

## 2018-02-02 NOTE — Assessment & Plan Note (Signed)
Not doing great right now because of social issues. Aware of counseling through work- will reach out to them. Continue current regimen at this time. Call with any concerns. Refills given.
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
## 2018-02-02 NOTE — Patient Instructions (Signed)
Health Maintenance, Female Adopting a healthy lifestyle and getting preventive care can go a long way to promote health and wellness. Talk with your health care provider about what schedule of regular examinations is right for you. This is a good chance for you to check in with your provider about disease prevention and staying healthy. In between checkups, there are plenty of things you can do on your own. Experts have done a lot of research about which lifestyle changes and preventive measures are most likely to keep you healthy. Ask your health care provider for more information. Weight and diet Eat a healthy diet  Be sure to include plenty of vegetables, fruits, low-fat dairy products, and lean protein.  Do not eat a lot of foods high in solid fats, added sugars, or salt.  Get regular exercise. This is one of the most important things you can do for your health. ? Most adults should exercise for at least 150 minutes each week. The exercise should increase your heart rate and make you sweat (moderate-intensity exercise). ? Most adults should also do strengthening exercises at least twice a week. This is in addition to the moderate-intensity exercise.  Maintain a healthy weight  Body mass index (BMI) is a measurement that can be used to identify possible weight problems. It estimates body fat based on height and weight. Your health care provider can help determine your BMI and help you achieve or maintain a healthy weight.  For females 20 years of age and older: ? A BMI below 18.5 is considered underweight. ? A BMI of 18.5 to 24.9 is normal. ? A BMI of 25 to 29.9 is considered overweight. ? A BMI of 30 and above is considered obese.  Watch levels of cholesterol and blood lipids  You should start having your blood tested for lipids and cholesterol at 31 years of age, then have this test every 5 years.  You may need to have your cholesterol levels checked more often if: ? Your lipid or  cholesterol levels are high. ? You are older than 31 years of age. ? You are at high risk for heart disease.  Cancer screening Lung Cancer  Lung cancer screening is recommended for adults 55-80 years old who are at high risk for lung cancer because of a history of smoking.  A yearly low-dose CT scan of the lungs is recommended for people who: ? Currently smoke. ? Have quit within the past 15 years. ? Have at least a 30-pack-year history of smoking. A pack year is smoking an average of one pack of cigarettes a day for 1 year.  Yearly screening should continue until it has been 15 years since you quit.  Yearly screening should stop if you develop a health problem that would prevent you from having lung cancer treatment.  Breast Cancer  Practice breast self-awareness. This means understanding how your breasts normally appear and feel.  It also means doing regular breast self-exams. Let your health care provider know about any changes, no matter how small.  If you are in your 20s or 30s, you should have a clinical breast exam (CBE) by a health care provider every 1-3 years as part of a regular health exam.  If you are 40 or older, have a CBE every year. Also consider having a breast X-ray (mammogram) every year.  If you have a family history of breast cancer, talk to your health care provider about genetic screening.  If you are at high risk   for breast cancer, talk to your health care provider about having an MRI and a mammogram every year.  Breast cancer gene (BRCA) assessment is recommended for women who have family members with BRCA-related cancers. BRCA-related cancers include: ? Breast. ? Ovarian. ? Tubal. ? Peritoneal cancers.  Results of the assessment will determine the need for genetic counseling and BRCA1 and BRCA2 testing.  Cervical Cancer Your health care provider may recommend that you be screened regularly for cancer of the pelvic organs (ovaries, uterus, and  vagina). This screening involves a pelvic examination, including checking for microscopic changes to the surface of your cervix (Pap test). You may be encouraged to have this screening done every 3 years, beginning at age 73.  For women ages 34-65, health care providers may recommend pelvic exams and Pap testing every 3 years, or they may recommend the Pap and pelvic exam, combined with testing for human papilloma virus (HPV), every 5 years. Some types of HPV increase your risk of cervical cancer. Testing for HPV may also be done on women of any age with unclear Pap test results.  Other health care providers may not recommend any screening for nonpregnant women who are considered low risk for pelvic cancer and who do not have symptoms. Ask your health care provider if a screening pelvic exam is right for you.  If you have had past treatment for cervical cancer or a condition that could lead to cancer, you need Pap tests and screening for cancer for at least 20 years after your treatment. If Pap tests have been discontinued, your risk factors (such as having a new sexual partner) need to be reassessed to determine if screening should resume. Some women have medical problems that increase the chance of getting cervical cancer. In these cases, your health care provider may recommend more frequent screening and Pap tests.  Colorectal Cancer  This type of cancer can be detected and often prevented.  Routine colorectal cancer screening usually begins at 31 years of age and continues through 31 years of age.  Your health care provider may recommend screening at an earlier age if you have risk factors for colon cancer.  Your health care provider may also recommend using home test kits to check for hidden blood in the stool.  A small camera at the end of a tube can be used to examine your colon directly (sigmoidoscopy or colonoscopy). This is done to check for the earliest forms of colorectal  cancer.  Routine screening usually begins at age 80.  Direct examination of the colon should be repeated every 5-10 years through 31 years of age. However, you may need to be screened more often if early forms of precancerous polyps or small growths are found.  Skin Cancer  Check your skin from head to toe regularly.  Tell your health care provider about any new moles or changes in moles, especially if there is a change in a mole's shape or color.  Also tell your health care provider if you have a mole that is larger than the size of a pencil eraser.  Always use sunscreen. Apply sunscreen liberally and repeatedly throughout the day.  Protect yourself by wearing long sleeves, pants, a wide-brimmed hat, and sunglasses whenever you are outside.  Heart disease, diabetes, and high blood pressure  High blood pressure causes heart disease and increases the risk of stroke. High blood pressure is more likely to develop in: ? People who have blood pressure in the high end of  the normal range (130-139/85-89 mm Hg). ? People who are overweight or obese. ? People who are African American.  If you are 21-29 years of age, have your blood pressure checked every 3-5 years. If you are 3 years of age or older, have your blood pressure checked every year. You should have your blood pressure measured twice-once when you are at a hospital or clinic, and once when you are not at a hospital or clinic. Record the average of the two measurements. To check your blood pressure when you are not at a hospital or clinic, you can use: ? An automated blood pressure machine at a pharmacy. ? A home blood pressure monitor.  If you are between 17 years and 37 years old, ask your health care provider if you should take aspirin to prevent strokes.  Have regular diabetes screenings. This involves taking a blood sample to check your fasting blood sugar level. ? If you are at a normal weight and have a low risk for diabetes,  have this test once every three years after 31 years of age. ? If you are overweight and have a high risk for diabetes, consider being tested at a younger age or more often. Preventing infection Hepatitis B  If you have a higher risk for hepatitis B, you should be screened for this virus. You are considered at high risk for hepatitis B if: ? You were born in a country where hepatitis B is common. Ask your health care provider which countries are considered high risk. ? Your parents were born in a high-risk country, and you have not been immunized against hepatitis B (hepatitis B vaccine). ? You have HIV or AIDS. ? You use needles to inject street drugs. ? You live with someone who has hepatitis B. ? You have had sex with someone who has hepatitis B. ? You get hemodialysis treatment. ? You take certain medicines for conditions, including cancer, organ transplantation, and autoimmune conditions.  Hepatitis C  Blood testing is recommended for: ? Everyone born from [birth year] through 1964. ? Anyone with known risk factors for hepatitis C.  Sexually transmitted infections (STIs)  You should be screened for sexually transmitted infections (STIs) including gonorrhea and chlamydia if: ? You are sexually active and are younger than 31 years of age. ? You are older than 31 years of age and your health care provider tells you that you are at risk for this type of infection. ? Your sexual activity has changed since you were last screened and you are at an increased risk for chlamydia or gonorrhea. Ask your health care provider if you are at risk.  If you do not have HIV, but are at risk, it may be recommended that you take a prescription medicine daily to prevent HIV infection. This is called pre-exposure prophylaxis (PrEP). You are considered at risk if: ? You are sexually active and do not regularly use condoms or know the HIV status of your partner(s). ? You take drugs by injection. ? You are  sexually active with a partner who has HIV.  Talk with your health care provider about whether you are at high risk of being infected with HIV. If you choose to begin PrEP, you should first be tested for HIV. You should then be tested every 3 months for as long as you are taking PrEP. Pregnancy  If you are premenopausal and you may become pregnant, ask your health care provider about preconception counseling.  If you may become  pregnant, take 400 to 800 micrograms (mcg) of folic acid every day.  If you want to prevent pregnancy, talk to your health care provider about birth control (contraception). Osteoporosis and menopause  Osteoporosis is a disease in which the bones lose minerals and strength with aging. This can result in serious bone fractures. Your risk for osteoporosis can be identified using a bone density scan.  If you are 42 years of age or older, or if you are at risk for osteoporosis and fractures, ask your health care provider if you should be screened.  Ask your health care provider whether you should take a calcium or vitamin D supplement to lower your risk for osteoporosis.  Menopause may have certain physical symptoms and risks.  Hormone replacement therapy may reduce some of these symptoms and risks. Talk to your health care provider about whether hormone replacement therapy is right for you. Follow these instructions at home:  Schedule regular health, dental, and eye exams.  Stay current with your immunizations.  Do not use any tobacco products including cigarettes, chewing tobacco, or electronic cigarettes.  If you are pregnant, do not drink alcohol.  If you are breastfeeding, limit how much and how often you drink alcohol.  Limit alcohol intake to no more than 1 drink per day for nonpregnant women. One drink equals 12 ounces of beer, 5 ounces of wine, or 1 ounces of hard liquor.  Do not use street drugs.  Do not share needles.  Ask your health care  provider for help if you need support or information about quitting drugs.  Tell your health care provider if you often feel depressed.  Tell your health care provider if you have ever been abused or do not feel safe at home. This information is not intended to replace advice given to you by your health care provider. Make sure you discuss any questions you have with your health care provider. Document Released: 02/10/2011 Document Revised: 01/03/2016 Document Reviewed: 05/01/2015 Elsevier Interactive Patient Education  2018 [employer]. HPV (Human Papillomavirus) Vaccine: What You Need to Know 1. Why get vaccinated? HPV vaccine prevents infection with human papillomavirus (HPV) types that are associated with many cancers, including:  cervical cancer in females,  vaginal and vulvar cancers in females,  anal cancer in females and males,  throat cancer in females and males, and  penile cancer in males.  In addition, HPV vaccine prevents infection with HPV types that cause genital warts in both females and males. In the [place] about 12,000 women get cervical cancer every year, and about 4,000 women die from it. HPV vaccine can prevent most of these cases of cervical cancer. Vaccination is not a substitute for cervical cancer screening. This vaccine does not protect against all HPV types that can cause cervical cancer. Women should still get regular Pap tests. HPV infection usually comes from sexual contact, and most people will become infected at some point in their life. About 14 million Americans, including teens, get infected every year. Most infections will go away on their own and not cause serious problems. But thousands of women and men get cancer and other diseases from HPV. 2. HPV vaccine HPV vaccine is approved by FDA and is recommended by CDC for both males and females. It is routinely given at 30 or 31 years of age, but it may be given beginning at age 6 years through age 53  years. Most adolescents 9 through 31 years of age should get HPV vaccine as a  two-dose series with the doses separated by 6-12 months. People who start HPV vaccination at 28 years of age and older should get the vaccine as a three-dose series with the second dose given 1-2 months after the first dose and the third dose given 6 months after the first dose. There are several exceptions to these age recommendations. Your health care provider can give you more information. 3. Some people should not get this vaccine  Anyone who has had a severe (life-threatening) allergic reaction to a dose of HPV vaccine should not get another dose.  Anyone who has a severe (life threatening) allergy to any component of HPV vaccine should not get the vaccine.  Tell your doctor if you have any severe allergies that you know of, including a severe allergy to yeast.  HPV vaccine is not recommended for pregnant women. If you learn that you were pregnant when you were vaccinated, there is no reason to expect any problems for you or your baby. Any woman who learns she was pregnant when she got HPV vaccine is encouraged to contact the manufacturer's registry for HPV vaccination during pregnancy at (732)506-0493. Women who are breastfeeding may be vaccinated.  If you have a mild illness, such as a cold, you can probably get the vaccine today. If you are moderately or severely ill, you should probably wait until you recover. Your doctor can advise you. 4. Risks of a vaccine reaction With any medicine, including vaccines, there is a chance of side effects. These are usually mild and go away on their own, but serious reactions are also possible. Most people who get HPV vaccine do not have any serious problems with it. Mild or moderate problems following HPV vaccine:  Reactions in the arm where the shot was given: ? Soreness (about 9 people in 10) ? Redness or swelling (about 1 person in 3)  Fever: ? Mild (100F) (about 1  person in 10) ? Moderate (102F) (about 1 person in 69)  Other problems: ? Headache (about 1 person in 3) Problems that could happen after any injected vaccine:  People sometimes faint after a medical procedure, including vaccination. Sitting or lying down for about 15 minutes can help prevent fainting, and injuries caused by a fall. Tell your doctor if you feel dizzy, or have vision changes or ringing in the ears.  Some people get severe pain in the shoulder and have difficulty moving the arm where a shot was given. This happens very rarely.  Any medication can cause a severe allergic reaction. Such reactions from a vaccine are very rare, estimated at about 1 in a million doses, and would happen within a few minutes to a few hours after the vaccination. As with any medicine, there is a very remote chance of a vaccine causing a serious injury or death. The safety of vaccines is always being monitored. For more information, visit: http://www.aguilar.org/. 5. What if there is a serious reaction? What should I look for? Look for anything that concerns you, such as signs of a severe allergic reaction, very high fever, or unusual behavior. Signs of a severe allergic reaction can include hives, swelling of the face and throat, difficulty breathing, a fast heartbeat, dizziness, and weakness. These would usually start a few minutes to a few hours after the vaccination. What should I do? If you think it is a severe allergic reaction or other emergency that can't wait, call 9-1-1 or get to the nearest hospital. Otherwise, call your doctor. Afterward, the  reaction should be reported to the Vaccine Adverse Event Reporting System (VAERS). Your doctor should file this report, or you can do it yourself through the VAERS web site at www.vaers.SamedayNews.es, or by calling (218) 370-2218. VAERS does not give medical advice. 6. The National Vaccine Injury Compensation Program The [employer] Vaccine Injury  Compensation Program (VICP) is a federal program that was created to compensate people who may have been injured by certain vaccines. Persons who believe they may have been injured by a vaccine can learn about the program and about filing a claim by calling 201-717-9205 or visiting the [place] website at GoldCloset.com.ee. There is a time limit to file a claim for compensation. 7. How can I learn more?  Ask your health care provider. He or she can give you the vaccine package insert or suggest other sources of information.  Call your local or state health department.  Contact the Centers for Disease Control and Prevention (CDC): ? Call (534) 727-1828 (1-800-CDC-INFO) or ? Visit CDC's website at http://sweeney-todd.com/ Vaccine Information Statement, HPV Vaccine (07/13/2015) This information is not intended to replace advice given to you by your health care provider. Make sure you discuss any questions you have with your health care provider. Document Released: 02/22/2014 Document Revised: 04/17/2016 Document Reviewed: 04/17/2016 Elsevier Interactive Patient Education  2017 [employer]. Tdap Vaccine (Tetanus, Diphtheria and Pertussis): What You Need to Know 1. Why get vaccinated? Tetanus, diphtheria and pertussis are very serious diseases. Tdap vaccine can protect [place] from these diseases. And, Tdap vaccine given to pregnant women can protect newborn babies against pertussis. TETANUS (Lockjaw) is rare in the [place] today. It causes painful muscle tightening and stiffness, usually all over the body.  It can lead to tightening of muscles in the head and neck so you can't open your mouth, swallow, or sometimes even breathe. Tetanus kills about 1 out of 10 people who are infected even after receiving the best medical care.  DIPHTHERIA is also rare in the [place] today. It can cause a thick coating to form in the back of the throat.  It can lead to breathing problems, heart  failure, paralysis, and death.  PERTUSSIS (Whooping Cough) causes severe coughing spells, which can cause difficulty breathing, vomiting and disturbed sleep.  It can also lead to weight loss, incontinence, and rib fractures. Up to 2 in 100 adolescents and 5 in 100 adults with pertussis are hospitalized or have complications, which could include pneumonia or death.  These diseases are caused by bacteria. Diphtheria and pertussis are spread from person to person through secretions from coughing or sneezing. Tetanus enters the body through cuts, scratches, or wounds. Before vaccines, as many as 200,000 cases of diphtheria, 200,000 cases of pertussis, and hundreds of cases of tetanus, were reported in the [place] each year. Since vaccination began, reports of cases for tetanus and diphtheria have dropped by about 99% and for pertussis by about 80%. 2. Tdap vaccine Tdap vaccine can protect adolescents and adults from tetanus, diphtheria, and pertussis. One dose of Tdap is routinely given at age 58 or 69. People who did not get Tdap at that age should get it as soon as possible. Tdap is especially important for healthcare professionals and anyone having close contact with a baby younger than 12 months. Pregnant women should get a dose of Tdap during every pregnancy, to protect the newborn from pertussis. Infants are most at risk for severe, life-threatening complications from pertussis. Another vaccine, called Td, protects against tetanus and diphtheria,  but not pertussis. A Td booster should be given every 10 years. Tdap may be given as one of these boosters if you have never gotten Tdap before. Tdap may also be given after a severe cut or burn to prevent tetanus infection. Your doctor or the person giving you the vaccine can give you more information. Tdap may safely be given at the same time as other vaccines. 3. Some people should not get this vaccine  A person who has ever had a  life-threatening allergic reaction after a previous dose of any diphtheria, tetanus or pertussis containing vaccine, OR has a severe allergy to any part of this vaccine, should not get Tdap vaccine. Tell the person giving the vaccine about any severe allergies.  Anyone who had coma or long repeated seizures within 7 days after a childhood dose of DTP or DTaP, or a previous dose of Tdap, should not get Tdap, unless a cause other than the vaccine was found. They can still get Td.  Talk to your doctor if you: ? have seizures or another nervous system problem, ? had severe pain or swelling after any vaccine containing diphtheria, tetanus or pertussis, ? ever had a condition called Guillain-Barr Syndrome (GBS), ? aren't feeling well on the day the shot is scheduled. 4. Risks With any medicine, including vaccines, there is a chance of side effects. These are usually mild and go away on their own. Serious reactions are also possible but are rare. Most people who get Tdap vaccine do not have any problems with it. Mild problems following Tdap: (Did not interfere with activities)  Pain where the shot was given (about 3 in 4 adolescents or 2 in 3 adults)  Redness or swelling where the shot was given (about 1 person in 5)  Mild fever of at least 100.70F (up to about 1 in 25 adolescents or 1 in 100 adults)  Headache (about 3 or 4 people in 10)  Tiredness (about 1 person in 3 or 4)  Nausea, vomiting, diarrhea, stomach ache (up to 1 in 4 adolescents or 1 in 10 adults)  Chills, sore joints (about 1 person in 10)  Body aches (about 1 person in 3 or 4)  Rash, swollen glands (uncommon)  Moderate problems following Tdap: (Interfered with activities, but did not require medical attention)  Pain where the shot was given (up to 1 in 5 or 6)  Redness or swelling where the shot was given (up to about 1 in 16 adolescents or 1 in 12 adults)  Fever over 102F (about 1 in 100 adolescents or 1 in 250  adults)  Headache (about 1 in 7 adolescents or 1 in 10 adults)  Nausea, vomiting, diarrhea, stomach ache (up to 1 or 3 people in 100)  Swelling of the entire arm where the shot was given (up to about 1 in 500).  Severe problems following Tdap: (Unable to perform usual activities; required medical attention)  Swelling, severe pain, bleeding and redness in the arm where the shot was given (rare).  Problems that could happen after any vaccine:  People sometimes faint after a medical procedure, including vaccination. Sitting or lying down for about 15 minutes can help prevent fainting, and injuries caused by a fall. Tell your doctor if you feel dizzy, or have vision changes or ringing in the ears.  Some people get severe pain in the shoulder and have difficulty moving the arm where a shot was given. This happens very rarely.  Any medication can cause  a severe allergic reaction. Such reactions from a vaccine are very rare, estimated at fewer than 1 in a million doses, and would happen within a few minutes to a few hours after the vaccination. As with any medicine, there is a very remote chance of a vaccine causing a serious injury or death. The safety of vaccines is always being monitored. For more information, visit: http://www.aguilar.org/ 5. What if there is a serious problem? What should I look for? Look for anything that concerns you, such as signs of a severe allergic reaction, very high fever, or unusual behavior. Signs of a severe allergic reaction can include hives, swelling of the face and throat, difficulty breathing, a fast heartbeat, dizziness, and weakness. These would usually start a few minutes to a few hours after the vaccination. What should I do?  If you think it is a severe allergic reaction or other emergency that can't wait, call 9-1-1 or get the person to the nearest hospital. Otherwise, call your doctor.  Afterward, the reaction should be reported to the Vaccine  Adverse Event Reporting System (VAERS). Your doctor might file this report, or you can do it yourself through the VAERS web site at www.vaers.SamedayNews.es, or by calling (551) 451-1020. ? VAERS does not give medical advice. 6. The National Vaccine Injury Compensation Program The [employer] Vaccine Injury Compensation Program (VICP) is a federal program that was created to compensate people who may have been injured by certain vaccines. Persons who believe they may have been injured by a vaccine can learn about the program and about filing a claim by calling 905-544-5048 or visiting the [place] website at GoldCloset.com.ee. There is a time limit to file a claim for compensation. 7. How can I learn more?  Ask your doctor. He or she can give you the vaccine package insert or suggest other sources of information.  Call your local or state health department.  Contact the Centers for Disease Control and Prevention (CDC): ? Call (732) 322-5499 (1-800-CDC-INFO) or ? Visit CDC's website at http://hunter.com/ CDC Tdap Vaccine VIS (10/04/13) This information is not intended to replace advice given to you by your health care provider. Make sure you discuss any questions you have with your health care provider. Document Released: 01/27/2012 Document Revised: 04/17/2016 Document Reviewed: 04/17/2016 Elsevier Interactive Patient Education  2017 [employer].

## 2018-02-03 LAB — COMPREHENSIVE METABOLIC PANEL
A/G RATIO: 1.6 (ref 1.2–2.2)
ALT: 12 IU/L (ref 0–32)
AST: 19 IU/L (ref 0–40)
Albumin: 4.6 g/dL (ref 3.5–5.5)
Alkaline Phosphatase: 50 IU/L (ref 39–117)
BILIRUBIN TOTAL: 0.3 mg/dL (ref 0.0–1.2)
BUN/Creatinine Ratio: 8 — ABNORMAL LOW (ref 9–23)
BUN: 6 mg/dL (ref 6–20)
CALCIUM: 9.7 mg/dL (ref 8.7–10.2)
CHLORIDE: 105 mmol/L (ref 96–106)
CO2: 26 mmol/L (ref 20–29)
Creatinine, Ser: 0.77 mg/dL (ref 0.57–1.00)
GFR calc Af Amer: 120 mL/min/{1.73_m2} (ref >59)
GFR, EST NON AFRICAN AMERICAN: 104 mL/min/{1.73_m2} (ref >59)
GLUCOSE: 88 mg/dL (ref 65–99)
Globulin, Total: 2.9 g/dL (ref 1.5–4.5)
POTASSIUM: 4.7 mmol/L (ref 3.5–5.2)
Sodium: 141 mmol/L (ref 134–144)
Total Protein: 7.5 g/dL (ref 6.0–8.5)

## 2018-02-03 LAB — CBC WITH DIFFERENTIAL/PLATELET
BASOS ABS: 0 10*3/uL (ref 0.0–0.2)
BASOS: 1 %
EOS (ABSOLUTE): 0.1 10*3/uL (ref 0.0–0.4)
Eos: 2 %
Hematocrit: 38.6 % (ref 34.0–46.6)
Hemoglobin: 12.3 g/dL (ref 11.1–15.9)
IMMATURE GRANS (ABS): 0 10*3/uL (ref 0.0–0.1)
IMMATURE GRANULOCYTES: 0 %
LYMPHS: 50 %
Lymphocytes Absolute: 2.3 10*3/uL (ref 0.7–3.1)
MCH: 27 pg (ref 26.6–33.0)
MCHC: 31.9 g/dL (ref 31.5–35.7)
MCV: 85 fL (ref 79–97)
MONOS ABS: 0.5 10*3/uL (ref 0.1–0.9)
Monocytes: 11 %
NEUTROS PCT: 36 %
Neutrophils Absolute: 1.7 10*3/uL (ref 1.4–7.0)
PLATELETS: 267 10*3/uL (ref 150–450)
RBC: 4.55 x10E6/uL (ref 3.77–5.28)
RDW: 13.2 % (ref 12.3–15.4)
WBC: 4.6 10*3/uL (ref 3.4–10.8)

## 2018-02-03 LAB — LIPID PANEL W/O CHOL/HDL RATIO
Cholesterol, Total: 177 mg/dL (ref 100–199)
HDL: 65 mg/dL (ref >39)
LDL Calculated: 96 mg/dL (ref 0–99)
TRIGLYCERIDES: 78 mg/dL (ref 0–149)
VLDL CHOLESTEROL CAL: 16 mg/dL (ref 5–40)

## 2018-02-03 LAB — TSH: TSH: 1.24 u[IU]/mL (ref 0.450–4.500)

## 2018-03-09 ENCOUNTER — Ambulatory Visit: Payer: 59 | Admitting: Family Medicine

## 2018-03-10 ENCOUNTER — Encounter: Payer: Self-pay | Admitting: Family Medicine

## 2018-03-10 ENCOUNTER — Ambulatory Visit (INDEPENDENT_AMBULATORY_CARE_PROVIDER_SITE_OTHER): Payer: 59 | Admitting: Family Medicine

## 2018-03-10 VITALS — BP 100/69 | HR 69 | Temp 97.6°F | Wt 123.2 lb

## 2018-03-10 DIAGNOSIS — M9908 Segmental and somatic dysfunction of rib cage: Secondary | ICD-10-CM | POA: Diagnosis not present

## 2018-03-10 DIAGNOSIS — M542 Cervicalgia: Secondary | ICD-10-CM | POA: Diagnosis not present

## 2018-03-10 DIAGNOSIS — M9901 Segmental and somatic dysfunction of cervical region: Secondary | ICD-10-CM | POA: Diagnosis not present

## 2018-03-10 DIAGNOSIS — M9902 Segmental and somatic dysfunction of thoracic region: Secondary | ICD-10-CM | POA: Diagnosis not present

## 2018-03-10 DIAGNOSIS — M9900 Segmental and somatic dysfunction of head region: Secondary | ICD-10-CM

## 2018-03-10 MED ORDER — CYCLOBENZAPRINE HCL 10 MG PO TABS: 10.0000 mg | ORAL_TABLET | Freq: Every day | ORAL | 0 refills | Status: DC

## 2018-03-10 NOTE — Progress Notes (Signed)
BP 100/69 (BP Location: Right Arm, Patient Position: Sitting, Cuff Size: Normal)   Pulse 69   Temp 97.6 F (36.4 C)   Wt 123 lb 4 oz (55.9 kg)   LMP 02/25/2017   SpO2 100%   BMI 18.47 kg/m    Subjective:    Patient ID: Courtney Rivers, female    DOB: 09/14/1985, 32 y.o.   MRN: [id number]  HPI: Courtney Rivers Septer is a 32 y.o. female  Chief Complaint  Patient presents with  . Neck Pain   Victorious presents today with neck pain that started 5 days ago. She is not sure what started it. She notes that it is in the back of her neck and radiates up into her head and into her shoulders. It is tight and aching in nature. Nothing is making it better or worse. It's constant and is making it hard for her to concentrate at work. She's tried tylenol and ibuprofen with minimal benefit. She is otherwise doing well with no other concerns or complaints at this time.   Relevant past medical, surgical, family and social history reviewed and updated as indicated. Interim medical history since our last visit reviewed. Allergies and medications reviewed and updated.  Review of Systems  Constitutional: Negative.   Respiratory: Negative.   Cardiovascular: Negative.   Musculoskeletal: Positive for myalgias, neck pain and neck stiffness. Negative for arthralgias, back pain, gait problem and joint swelling.  Skin: Negative.   Neurological: Negative.   Psychiatric/Behavioral: Negative.     Per HPI unless specifically indicated above     Objective:    BP 100/69 (BP Location: Right Arm, Patient Position: Sitting, Cuff Size: Normal)   Pulse 69   Temp 97.6 F (36.4 C)   Wt 123 lb 4 oz (55.9 kg)   LMP 02/25/2017   SpO2 100%   BMI 18.47 kg/m   Wt Readings from Last 3 Encounters:  03/10/18 123 lb 4 oz (55.9 kg)  02/02/18 123 lb (55.8 kg)  12/02/17 126 lb (57.2 kg)    Physical Exam  Constitutional: She is oriented to person, place, and time. She appears well-developed and well-nourished.  No distress.  HENT:  Head: Normocephalic and atraumatic.  Right Ear: Hearing normal.  Left Ear: Hearing normal.  Nose: Nose normal.  Eyes: Conjunctivae and lids are normal. Right eye exhibits no discharge. Left eye exhibits no discharge. No scleral icterus.  Pulmonary/Chest: Effort normal. No respiratory distress.  Abdominal: Soft. She exhibits no distension and no mass. There is no tenderness. There is no rebound and no guarding. No hernia.  Neurological: She is alert and oriented to person, place, and time.  Skin: Skin is warm, dry and intact. Capillary refill takes less than 2 seconds. No rash noted. She is not diaphoretic. No erythema. No pallor.  Psychiatric: She has a normal mood and affect. Her speech is normal and behavior is normal. Judgment and thought content normal. Cognition and memory are normal.  Nursing note and vitals reviewed. Musculoskeletal:  Exam found Decreased ROM, Tissue texture changes, Tenderness to palpation and Asymmetry of patient's  head, neck, thorax and ribs Osteopathic Structural Exam:   Head: hypertonic suboccipital muscles, OAESSL, temporal internally rotated and anterior on the R  Neck: C3ESRR, C4ESRL, paraspinals hypertonic bilaterally  Thorax: trap spasm bilaterally, rhomboid spasm bilaterally, T3ESRR  Ribs: Rib 6 locked up on R, rib 5 locked up on the L   Results for orders placed or performed in visit on 02/02/18  CBC with Differential/Platelet  Result Value Ref Range   WBC 4.6 3.4 - 10.8 x10E3/uL   RBC 4.55 3.77 - 5.28 x10E6/uL   Hemoglobin 12.3 11.1 - 15.9 g/dL   Hematocrit 16.1 09.6 - 46.6 %   MCV 85 79 - 97 fL   MCH 27.0 26.6 - 33.0 pg   MCHC 31.9 31.5 - 35.7 g/dL   RDW 04.5 40.9 - 81.1 %   Platelets 267 150 - 450 x10E3/uL   Neutrophils 36 Not Estab. %   Lymphs 50 Not Estab. %   Monocytes 11 Not Estab. %   Eos 2 Not Estab. %   Basos 1 Not Estab. %   Neutrophils Absolute 1.7 1.4 - 7.0 x10E3/uL   Lymphocytes Absolute 2.3 0.7 - 3.1  x10E3/uL   Monocytes Absolute 0.5 0.1 - 0.9 x10E3/uL   EOS (ABSOLUTE) 0.1 0.0 - 0.4 x10E3/uL   Basophils Absolute 0.0 0.0 - 0.2 x10E3/uL   Immature Granulocytes 0 Not Estab. %   Immature Grans (Abs) 0.0 0.0 - 0.1 x10E3/uL  Comprehensive metabolic panel  Result Value Ref Range   Glucose 88 65 - 99 mg/dL   BUN 6 6 - 20 mg/dL   Creatinine, Ser 9.14 0.57 - 1.00 mg/dL   GFR calc non Af Amer 104 >59 mL/min/1.73   GFR calc Af Amer 120 >59 mL/min/1.73   BUN/Creatinine Ratio 8 (L) 9 - 23   Sodium 141 134 - 144 mmol/L   Potassium 4.7 3.5 - 5.2 mmol/L   Chloride 105 96 - 106 mmol/L   CO2 26 20 - 29 mmol/L   Calcium 9.7 8.7 - 10.2 mg/dL   Total Protein 7.5 6.0 - 8.5 g/dL   Albumin 4.6 3.5 - 5.5 g/dL   Globulin, Total 2.9 1.5 - 4.5 g/dL   Albumin/Globulin Ratio 1.6 1.2 - 2.2   Bilirubin Total 0.3 0.0 - 1.2 mg/dL   Alkaline Phosphatase 50 39 - 117 IU/L   AST 19 0 - 40 IU/L   ALT 12 0 - 32 IU/L  Lipid Panel w/o Chol/HDL Ratio  Result Value Ref Range   Cholesterol, Total 177 100 - 199 mg/dL   Triglycerides 78 0 - 149 mg/dL   HDL 65 >78 mg/dL   VLDL Cholesterol Cal 16 5 - 40 mg/dL   LDL Calculated 96 0 - 99 mg/dL  TSH  Result Value Ref Range   TSH 1.240 0.450 - 4.500 uIU/mL  UA/M w/rflx Culture, Routine  Result Value Ref Range   Specific Gravity, UA 1.015 1.005 - 1.030   pH, UA 7.0 5.0 - 7.5   Color, UA Yellow Yellow   Appearance Ur Clear Clear   Leukocytes, UA Negative Negative   Protein, UA Negative Negative/Trace   Glucose, UA Negative Negative   Ketones, UA Negative Negative   RBC, UA Negative Negative   Bilirubin, UA Negative Negative   Urobilinogen, Ur 1.0 0.2 - 1.0 mg/dL   Nitrite, UA Negative Negative      Assessment & Plan:   Problem List Items Addressed This Visit    None    Visit Diagnoses    Neck pain    -  Primary   In acute pain for unknown reason. She has somatic dysfunction that is contributing to her symptoms. Treated today as below. Rx for flexeril sent to  her pharmacy   Head region somatic dysfunction       Cervical segment dysfunction       Thoracic segment dysfunction       Rib  cage region somatic dysfunction         After verbal consent was obtained, patient was treated today with osteopathic manipulative medicine to the regions of the head, neck, thorax and ribs using the techniques of cranial, myofascial release, counterstrain, muscle energy, HVLA and soft tissue. Areas of compensation relating to her primary pain source also treated. Patient tolerated the procedure well with good objective and good subjective improvement in symptoms. She left the room in good condition. She was advised to stay well hydrated and that she may have some soreness following the procedure. If not improving or worsening, she will call and come in. She will return for reevaluation  on a PRN basis.   Follow up plan: Return if symptoms worsen or fail to improve.

## 2018-03-17 DIAGNOSIS — H5213 Myopia, bilateral: Secondary | ICD-10-CM | POA: Diagnosis not present

## 2018-04-24 ENCOUNTER — Other Ambulatory Visit: Payer: Self-pay

## 2018-04-24 ENCOUNTER — Ambulatory Visit
Admission: EM | Admit: 2018-04-24 | Discharge: 2018-04-24 | Disposition: A | Discharge status: Home / Self Care | Payer: 59 | Attending: Family Medicine | Admitting: Family Medicine

## 2018-04-24 ENCOUNTER — Ambulatory Visit (INDEPENDENT_AMBULATORY_CARE_PROVIDER_SITE_OTHER): Payer: 59

## 2018-04-24 DIAGNOSIS — S99912A Unspecified injury of left ankle, initial encounter: Secondary | ICD-10-CM | POA: Diagnosis not present

## 2018-04-24 DIAGNOSIS — M25572 Pain in left ankle and joints of left foot: Secondary | ICD-10-CM | POA: Diagnosis not present

## 2018-04-24 MED ORDER — MELOXICAM 15 MG PO TABS: 15.0000 mg | ORAL_TABLET | Freq: Every day | ORAL | 0 refills | Status: DC | PRN

## 2018-04-24 NOTE — ED Notes (Signed)
Lace-up ankle brace applied left foot

## 2018-04-24 NOTE — ED Triage Notes (Signed)
Pt fell 3 weeks ago and states she is still having left lateral ankle pain. Pt with steady gait in triage. No limping noted. Pain 5/10
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
## 2018-04-24 NOTE — Discharge Instructions (Signed)
Rest.  Use the medication and the brace as needed.  Take care  Dr. Adriana Simasook

## 2018-04-29 ENCOUNTER — Telehealth: Payer: Self-pay

## 2018-04-29 MED ORDER — VENLAFAXINE HCL ER 75 MG PO CP24: ORAL_CAPSULE | ORAL | 1 refills | Status: DC

## 2018-04-29 MED ORDER — PRAZOSIN HCL 1 MG PO CAPS: 1.0000 mg | ORAL_CAPSULE | Freq: Every day | ORAL | 3 refills | Status: DC

## 2018-04-29 MED ORDER — QUETIAPINE FUMARATE ER 50 MG PO TB24: 50.0000 mg | ORAL_TABLET | Freq: Every day | ORAL | 1 refills | Status: DC

## 2018-04-29 MED ORDER — CYCLOBENZAPRINE HCL 10 MG PO TABS: 10.0000 mg | ORAL_TABLET | Freq: Every day | ORAL | 0 refills | Status: DC

## 2018-04-29 MED ORDER — MELOXICAM 15 MG PO TABS: 15.0000 mg | ORAL_TABLET | Freq: Every day | ORAL | 0 refills | Status: DC | PRN

## 2018-04-29 MED ORDER — ONDANSETRON 4 MG PO TBDP: 4.0000 mg | ORAL_TABLET | Freq: Three times a day (TID) | ORAL | 3 refills | Status: DC | PRN

## 2018-04-29 MED ORDER — MONTELUKAST SODIUM 10 MG PO TABS: 10.0000 mg | ORAL_TABLET | Freq: Every day | ORAL | 1 refills | Status: DC

## 2018-04-29 NOTE — Telephone Encounter (Signed)
[facility] patient's [facility]

## 2018-04-30 ENCOUNTER — Other Ambulatory Visit: Payer: Self-pay | Admitting: Family Medicine

## 2018-04-30 MED ORDER — QUETIAPINE FUMARATE ER 50 MG PO TB24: 50.0000 mg | ORAL_TABLET | Freq: Every day | ORAL | 1 refills | Status: DC

## 2018-05-11 ENCOUNTER — Telehealth: Payer: Self-pay

## 2018-05-11 NOTE — Telephone Encounter (Signed)
PA submitted for Quetiapine 50mg  1-2 tablets nightly as needed.

## 2018-05-20 ENCOUNTER — Ambulatory Visit (INDEPENDENT_AMBULATORY_CARE_PROVIDER_SITE_OTHER): Payer: 59 | Admitting: Family Medicine

## 2018-05-20 ENCOUNTER — Ambulatory Visit
Admission: RE | Admit: 2018-05-20 | Discharge: 2018-05-20 | Disposition: A | Discharge status: Home / Self Care | Payer: 59 | Source: Ambulatory Visit | Attending: Family Medicine | Admitting: Family Medicine

## 2018-05-20 ENCOUNTER — Encounter: Payer: Self-pay | Admitting: Family Medicine

## 2018-05-20 VITALS — BP 90/57 | HR 67 | Temp 98.8°F | Wt 126.1 lb

## 2018-05-20 DIAGNOSIS — M25572 Pain in left ankle and joints of left foot: Secondary | ICD-10-CM

## 2018-05-20 DIAGNOSIS — M79672 Pain in left foot: Secondary | ICD-10-CM

## 2018-05-20 DIAGNOSIS — M25472 Effusion, left ankle: Secondary | ICD-10-CM | POA: Diagnosis not present

## 2018-05-20 NOTE — Progress Notes (Signed)
BP (!) 90/57 (BP Location: Left Arm, Patient Position: Sitting, Cuff Size: Normal)   Pulse 67   Temp 98.8 F (37.1 C)   Wt 126 lb 1 oz (57.2 kg)   LMP 02/25/2017   SpO2 100%   BMI 19.74 kg/m    Subjective:    Patient ID: Courtney Rivers, female    DOB: 1985/09/11, 32 y.o.   MRN: [id number]  HPI: Courtney Rivers is a 32 y.o. female  Chief Complaint  Patient presents with  . Ankle Pain    and fott, left   ANKLE PAIN- fell down the steps, wore boot for 2 weeks, then fell down the steps again. Has been hurting worse.  Duration: 4 weeks, worse in the last week Involved foot: left Mechanism of injury: trauma Location: ankle joint on the L all over Onset: sudden  Severity: 7/10  Quality:  sharp Frequency: constant Radiation: no Aggravating factors: weight bearing, walking and movement  Alleviating factors: boot   Status: worse Treatments attempted: rest, ice, APAP, ibuprofen and HEP  Relief with NSAIDs?:  mild Weakness with weight bearing or walking: yes Morning stiffness: no Swelling: yes Redness: no Bruising: no Paresthesias / decreased sensation: no  Fevers:no   Relevant past medical, surgical, family and social history reviewed and updated as indicated. Interim medical history since our last visit reviewed. Allergies and medications reviewed and updated.  Review of Systems  Constitutional: Negative.   Respiratory: Negative.   Cardiovascular: Negative.   Musculoskeletal: Positive for arthralgias and joint swelling. Negative for back pain, gait problem, myalgias, neck pain and neck stiffness.  Skin: Negative.   Psychiatric/Behavioral: Negative.     Per HPI unless specifically indicated above     Objective:    BP (!) 90/57 (BP Location: Left Arm, Patient Position: Sitting, Cuff Size: Normal)   Pulse 67   Temp 98.8 F (37.1 C)   Wt 126 lb 1 oz (57.2 kg)   LMP 02/25/2017   SpO2 100%   BMI 19.74 kg/m   Wt Readings from Last 3 Encounters:    05/20/18 126 lb 1 oz (57.2 kg)  04/24/18 124 lb (56.2 kg)  03/10/18 123 lb 4 oz (55.9 kg)    Physical Exam  Constitutional: She is oriented to person, place, and time. She appears well-developed and well-nourished. No distress.  HENT:  Head: Normocephalic and atraumatic.  Right Ear: Hearing normal.  Left Ear: Hearing normal.  Nose: Nose normal.  Eyes: Conjunctivae and lids are normal. Right eye exhibits no discharge. Left eye exhibits no discharge. No scleral icterus.  Cardiovascular: Normal rate, regular rhythm, normal heart sounds and intact distal pulses. Exam reveals no gallop and no friction rub.  No murmur heard. Pulmonary/Chest: Effort normal and breath sounds normal. No stridor. No respiratory distress. She has no wheezes. She has no rales. She exhibits no tenderness.  Musculoskeletal: She exhibits edema and tenderness. She exhibits no deformity.  Significant swelling and tenderness over L lateral maleolus, antalgic gait  Neurological: She is alert and oriented to person, place, and time.  Skin: Skin is warm, dry and intact. Capillary refill takes less than 2 seconds. No rash noted. She is not diaphoretic. No erythema. No pallor.  Psychiatric: She has a normal mood and affect. Her speech is normal and behavior is normal. Judgment and thought content normal. Cognition and memory are normal.  Nursing note and vitals reviewed.   Results for orders placed or performed in visit on 02/02/18  CBC with Differential/Platelet  Result Value Ref Range   WBC 4.6 3.4 - 10.8 x10E3/uL   RBC 4.55 3.77 - 5.28 x10E6/uL   Hemoglobin 12.3 11.1 - 15.9 g/dL   Hematocrit 16.1 09.6 - 46.6 %   MCV 85 79 - 97 fL   MCH 27.0 26.6 - 33.0 pg   MCHC 31.9 31.5 - 35.7 g/dL   RDW 04.5 40.9 - 81.1 %   Platelets 267 150 - 450 x10E3/uL   Neutrophils 36 Not Estab. %   Lymphs 50 Not Estab. %   Monocytes 11 Not Estab. %   Eos 2 Not Estab. %   Basos 1 Not Estab. %   Neutrophils Absolute 1.7 1.4 - 7.0  x10E3/uL   Lymphocytes Absolute 2.3 0.7 - 3.1 x10E3/uL   Monocytes Absolute 0.5 0.1 - 0.9 x10E3/uL   EOS (ABSOLUTE) 0.1 0.0 - 0.4 x10E3/uL   Basophils Absolute 0.0 0.0 - 0.2 x10E3/uL   Immature Granulocytes 0 Not Estab. %   Immature Grans (Abs) 0.0 0.0 - 0.1 x10E3/uL  Comprehensive metabolic panel  Result Value Ref Range   Glucose 88 65 - 99 mg/dL   BUN 6 6 - 20 mg/dL   Creatinine, Ser 9.14 0.57 - 1.00 mg/dL   GFR calc non Af Amer 104 >59 mL/min/1.73   GFR calc Af Amer 120 >59 mL/min/1.73   BUN/Creatinine Ratio 8 (L) 9 - 23   Sodium 141 134 - 144 mmol/L   Potassium 4.7 3.5 - 5.2 mmol/L   Chloride 105 96 - 106 mmol/L   CO2 26 20 - 29 mmol/L   Calcium 9.7 8.7 - 10.2 mg/dL   Total Protein 7.5 6.0 - 8.5 g/dL   Albumin 4.6 3.5 - 5.5 g/dL   Globulin, Total 2.9 1.5 - 4.5 g/dL   Albumin/Globulin Ratio 1.6 1.2 - 2.2   Bilirubin Total 0.3 0.0 - 1.2 mg/dL   Alkaline Phosphatase 50 39 - 117 IU/L   AST 19 0 - 40 IU/L   ALT 12 0 - 32 IU/L  Lipid Panel w/o Chol/HDL Ratio  Result Value Ref Range   Cholesterol, Total 177 100 - 199 mg/dL   Triglycerides 78 0 - 149 mg/dL   HDL 65 >78 mg/dL   VLDL Cholesterol Cal 16 5 - 40 mg/dL   LDL Calculated 96 0 - 99 mg/dL  TSH  Result Value Ref Range   TSH 1.240 0.450 - 4.500 uIU/mL  UA/M w/rflx Culture, Routine  Result Value Ref Range   Specific Gravity, UA 1.015 1.005 - 1.030   pH, UA 7.0 5.0 - 7.5   Color, UA Yellow Yellow   Appearance Ur Clear Clear   Leukocytes, UA Negative Negative   Protein, UA Negative Negative/Trace   Glucose, UA Negative Negative   Ketones, UA Negative Negative   RBC, UA Negative Negative   Bilirubin, UA Negative Negative   Urobilinogen, Ur 1.0 0.2 - 1.0 mg/dL   Nitrite, UA Negative Negative      Assessment & Plan:   Problem List Items Addressed This Visit    None    Visit Diagnoses    Acute left ankle pain    -  Primary   Getting worse. Will repeat x-ray and get her into ortho. Await results and call with any  concerns. Continue boot.   Relevant Orders   DG Ankle Complete Left   Ambulatory referral to Orthopedic Surgery   Left foot pain       Getting worse. Will repeat x-ray and get her into  ortho. Await results and call with any concerns. Continue boot.   Relevant Orders   DG Foot Complete Left   Ambulatory referral to Orthopedic Surgery       Follow up plan: Return if symptoms worsen or fail to improve.

## 2018-05-26 DIAGNOSIS — S93491A Sprain of other ligament of right ankle, initial encounter: Secondary | ICD-10-CM | POA: Diagnosis not present

## 2018-05-28 ENCOUNTER — Encounter: Payer: Self-pay | Admitting: Family Medicine

## 2018-05-28 ENCOUNTER — Telehealth: Payer: Self-pay | Admitting: Family Medicine

## 2018-05-28 NOTE — Telephone Encounter (Signed)
Letter written and given to patient

## 2018-05-28 NOTE — Telephone Encounter (Signed)
Patient needs a note stating she can wear tennis shoes to work.  ([place])  Thanks

## 2018-06-01 ENCOUNTER — Ambulatory Visit (INDEPENDENT_AMBULATORY_CARE_PROVIDER_SITE_OTHER): Payer: 59 | Admitting: Family Medicine

## 2018-06-01 ENCOUNTER — Encounter: Payer: Self-pay | Admitting: Family Medicine

## 2018-06-01 VITALS — BP 107/73 | HR 69 | Temp 98.5°F | Wt 126.3 lb

## 2018-06-01 DIAGNOSIS — M9903 Segmental and somatic dysfunction of lumbar region: Secondary | ICD-10-CM

## 2018-06-01 DIAGNOSIS — M9904 Segmental and somatic dysfunction of sacral region: Secondary | ICD-10-CM | POA: Diagnosis not present

## 2018-06-01 DIAGNOSIS — M9905 Segmental and somatic dysfunction of pelvic region: Secondary | ICD-10-CM

## 2018-06-01 DIAGNOSIS — M9908 Segmental and somatic dysfunction of rib cage: Secondary | ICD-10-CM | POA: Diagnosis not present

## 2018-06-01 DIAGNOSIS — M9909 Segmental and somatic dysfunction of abdomen and other regions: Secondary | ICD-10-CM | POA: Diagnosis not present

## 2018-06-01 DIAGNOSIS — M9901 Segmental and somatic dysfunction of cervical region: Secondary | ICD-10-CM | POA: Diagnosis not present

## 2018-06-01 DIAGNOSIS — M545 Low back pain, unspecified: Secondary | ICD-10-CM

## 2018-06-01 NOTE — Progress Notes (Signed)
BP 107/73   Pulse 69   Temp 98.5 F (36.9 C) (Oral)   Wt 126 lb 4.8 oz (57.3 kg)   LMP 02/25/2017   SpO2 99%   BMI 19.78 kg/m    Subjective:    Patient ID: BERT GIVANS, female    DOB: 05/18/1986, 32 y.o.   MRN: [id number]  HPI: Nare TRINADEE VERHAGEN is a 32 y.o. female  Chief Complaint  Patient presents with  . Back Pain   Chistan comes in today complaining of back pain. She has been wearing a boot on her L foot due to a fall and it has been throwing off how she has been walking. She notes that her low back and her neck have really been bothering her. She feels sore and achey. Better with rest and medicine, worse with moving around a lot. She has no radiation. She is otherwise feeling well with no other concerns or complaints at this time.   Relevant past medical, surgical, family and social history reviewed and updated as indicated. Interim medical history since our last visit reviewed. Allergies and medications reviewed and updated.  Review of Systems  Constitutional: Negative.   Respiratory: Negative.   Cardiovascular: Negative.   Musculoskeletal: Positive for arthralgias, back pain, gait problem and myalgias. Negative for joint swelling, neck pain and neck stiffness.  Skin: Negative.   Neurological: Negative for dizziness, tremors, seizures, syncope, facial asymmetry, speech difficulty, weakness, light-headedness, numbness and headaches.  Psychiatric/Behavioral: Negative.     Per HPI unless specifically indicated above     Objective:    BP 107/73   Pulse 69   Temp 98.5 F (36.9 C) (Oral)   Wt 126 lb 4.8 oz (57.3 kg)   LMP 02/25/2017   SpO2 99%   BMI 19.78 kg/m   Wt Readings from Last 3 Encounters:  06/01/18 126 lb 4.8 oz (57.3 kg)  05/20/18 126 lb 1 oz (57.2 kg)  04/24/18 124 lb (56.2 kg)    Physical Exam  Constitutional: She is oriented to person, place, and time. She appears well-developed and well-nourished. No distress.  HENT:  Head:  Normocephalic and atraumatic.  Right Ear: Hearing normal.  Left Ear: Hearing normal.  Nose: Nose normal.  Eyes: Conjunctivae and lids are normal. Right eye exhibits no discharge. Left eye exhibits no discharge. No scleral icterus.  Cardiovascular: Normal rate, regular rhythm, normal heart sounds and intact distal pulses. Exam reveals no gallop and no friction rub.  No murmur heard. Pulmonary/Chest: Effort normal and breath sounds normal. No stridor. No respiratory distress. She has no wheezes. She has no rales. She exhibits no tenderness.  Abdominal: Soft. She exhibits no distension and no mass. There is no tenderness. There is no rebound and no guarding. No hernia.  Musculoskeletal: Normal range of motion.  Neurological: She is alert and oriented to person, place, and time.  Skin: Skin is warm, dry and intact. Capillary refill takes less than 2 seconds. No rash noted. She is not diaphoretic. No erythema. No pallor.  Psychiatric: She has a normal mood and affect. Her speech is normal and behavior is normal. Judgment and thought content normal. Cognition and memory are normal.  Nursing note and vitals reviewed. Musculoskeletal:  Exam found Decreased ROM, Tissue texture changes, Tenderness to palpation and Asymmetry of patient's  neck, ribs, lumbar, pelvis, sacrum and abdomen Osteopathic Structural Exam:   Neck: C3ESRR, C4ESRR, C5ESRR  Ribs: Ribs 4-6 locked up on the R, ribs 6-8 locked up on the L  Lumbar: QL hypertonic on the L, L3-5SRRL  Pelvis: Posterior L innominate  Sacrum: R on R torsion  Abdomen: diaphragm spasm on the L   Results for orders placed or performed in visit on 02/02/18  CBC with Differential/Platelet  Result Value Ref Range   WBC 4.6 3.4 - 10.8 x10E3/uL   RBC 4.55 3.77 - 5.28 x10E6/uL   Hemoglobin 12.3 11.1 - 15.9 g/dL   Hematocrit 16.1 09.6 - 46.6 %   MCV 85 79 - 97 fL   MCH 27.0 26.6 - 33.0 pg   MCHC 31.9 31.5 - 35.7 g/dL   RDW 04.5 40.9 - 81.1 %   Platelets 267  150 - 450 x10E3/uL   Neutrophils 36 Not Estab. %   Lymphs 50 Not Estab. %   Monocytes 11 Not Estab. %   Eos 2 Not Estab. %   Basos 1 Not Estab. %   Neutrophils Absolute 1.7 1.4 - 7.0 x10E3/uL   Lymphocytes Absolute 2.3 0.7 - 3.1 x10E3/uL   Monocytes Absolute 0.5 0.1 - 0.9 x10E3/uL   EOS (ABSOLUTE) 0.1 0.0 - 0.4 x10E3/uL   Basophils Absolute 0.0 0.0 - 0.2 x10E3/uL   Immature Granulocytes 0 Not Estab. %   Immature Grans (Abs) 0.0 0.0 - 0.1 x10E3/uL  Comprehensive metabolic panel  Result Value Ref Range   Glucose 88 65 - 99 mg/dL   BUN 6 6 - 20 mg/dL   Creatinine, Ser 9.14 0.57 - 1.00 mg/dL   GFR calc non Af Amer 104 >59 mL/min/1.73   GFR calc Af Amer 120 >59 mL/min/1.73   BUN/Creatinine Ratio 8 (L) 9 - 23   Sodium 141 134 - 144 mmol/L   Potassium 4.7 3.5 - 5.2 mmol/L   Chloride 105 96 - 106 mmol/L   CO2 26 20 - 29 mmol/L   Calcium 9.7 8.7 - 10.2 mg/dL   Total Protein 7.5 6.0 - 8.5 g/dL   Albumin 4.6 3.5 - 5.5 g/dL   Globulin, Total 2.9 1.5 - 4.5 g/dL   Albumin/Globulin Ratio 1.6 1.2 - 2.2   Bilirubin Total 0.3 0.0 - 1.2 mg/dL   Alkaline Phosphatase 50 39 - 117 IU/L   AST 19 0 - 40 IU/L   ALT 12 0 - 32 IU/L  Lipid Panel w/o Chol/HDL Ratio  Result Value Ref Range   Cholesterol, Total 177 100 - 199 mg/dL   Triglycerides 78 0 - 149 mg/dL   HDL 65 >78 mg/dL   VLDL Cholesterol Cal 16 5 - 40 mg/dL   LDL Calculated 96 0 - 99 mg/dL  TSH  Result Value Ref Range   TSH 1.240 0.450 - 4.500 uIU/mL  UA/M w/rflx Culture, Routine  Result Value Ref Range   Specific Gravity, UA 1.015 1.005 - 1.030   pH, UA 7.0 5.0 - 7.5   Color, UA Yellow Yellow   Appearance Ur Clear Clear   Leukocytes, UA Negative Negative   Protein, UA Negative Negative/Trace   Glucose, UA Negative Negative   Ketones, UA Negative Negative   RBC, UA Negative Negative   Bilirubin, UA Negative Negative   Urobilinogen, Ur 1.0 0.2 - 1.0 mg/dL   Nitrite, UA Negative Negative      Assessment & Plan:   Problem List  Items Addressed This Visit    None    Visit Diagnoses    Acute left-sided low back pain without sciatica    -  Primary   Seems to be myofascial and related to her use of a  boot. Treated today with OMT as below with good results. Call with concerns.    Cervical segment dysfunction       Rib cage region somatic dysfunction       Lumbar region somatic dysfunction       Sacral region somatic dysfunction       Pelvic somatic dysfunction       Segmental dysfunction of abdomen         After verbal consent was obtained, patient was treated today with osteopathic manipulative medicine to the regions of the neck, ribs, lumbar, pelvis, sacrum and abdomen using the techniques of myofascial release, counterstrain, muscle energy, HVLA and soft tissue. Areas of compensation relating to her primary pain source also treated. Patient tolerated the procedure well with good objective and good subjective improvement in symptoms. She left the room in good condition. She was advised to stay well hydrated and that she may have some soreness following the procedure. If not improving or worsening, she will call and come in. She will return for reevaluation  on a PRN basis.   Follow up plan: Return if symptoms worsen or fail to improve.

## 2018-06-08 ENCOUNTER — Encounter: Payer: Self-pay | Admitting: Family Medicine

## 2018-06-08 ENCOUNTER — Ambulatory Visit (INDEPENDENT_AMBULATORY_CARE_PROVIDER_SITE_OTHER): Payer: 59 | Admitting: Family Medicine

## 2018-06-08 VITALS — BP 115/81 | HR 96 | Temp 98.0°F | Wt 128.2 lb

## 2018-06-08 DIAGNOSIS — N898 Other specified noninflammatory disorders of vagina: Secondary | ICD-10-CM | POA: Diagnosis not present

## 2018-06-08 DIAGNOSIS — N760 Acute vaginitis: Secondary | ICD-10-CM | POA: Diagnosis not present

## 2018-06-08 DIAGNOSIS — B9689 Other specified bacterial agents as the cause of diseases classified elsewhere: Secondary | ICD-10-CM

## 2018-06-08 LAB — WET PREP FOR TRICH, YEAST, CLUE
Clue Cell Exam: POSITIVE — AB
Trichomonas Exam: NEGATIVE
Yeast Exam: NEGATIVE

## 2018-06-08 MED ORDER — METRONIDAZOLE 500 MG PO TABS: 500.0000 mg | ORAL_TABLET | Freq: Two times a day (BID) | ORAL | 0 refills | Status: DC

## 2018-06-08 NOTE — Progress Notes (Signed)
BP 115/81 (BP Location: Left Arm, Patient Position: Sitting, Cuff Size: Normal)   Pulse 96   Temp 98 F (36.7 C)   Wt 128 lb 4 oz (58.2 kg)   LMP 02/25/2017   SpO2 99%   BMI 20.09 kg/m    Subjective:    Patient ID: Courtney Rivers, female    DOB: 04-18-1986, 32 y.o.   MRN: [id number]  HPI: Courtney Rivers is a 32 y.o. female  Chief Complaint  Patient presents with  . Vaginal Discharge   VAGINAL DISCHARGE Duration: 4-5 days Discharge description: mucous  Pruritus: yes Dysuria: no Malodorous: yes Urinary frequency: no Fevers: no Abdominal pain: no  Sexual activity: monogamous History of sexually transmitted diseases: no Recent antibiotic use: no Context: stable Treatments attempted: none  Relevant past medical, surgical, family and social history reviewed and updated as indicated. Interim medical history since our last visit reviewed. Allergies and medications reviewed and updated.  Review of Systems  Constitutional: Negative.   Respiratory: Negative.   Cardiovascular: Negative.   Genitourinary: Positive for vaginal discharge. Negative for decreased urine volume, difficulty urinating, dyspareunia, dysuria, enuresis, flank pain, frequency, genital sores, hematuria, menstrual problem, pelvic pain, urgency, vaginal bleeding and vaginal pain.  Psychiatric/Behavioral: Negative.     Per HPI unless specifically indicated above     Objective:    BP 115/81 (BP Location: Left Arm, Patient Position: Sitting, Cuff Size: Normal)   Pulse 96   Temp 98 F (36.7 C)   Wt 128 lb 4 oz (58.2 kg)   LMP 02/25/2017   SpO2 99%   BMI 20.09 kg/m   Wt Readings from Last 3 Encounters:  06/08/18 128 lb 4 oz (58.2 kg)  06/01/18 126 lb 4.8 oz (57.3 kg)  05/20/18 126 lb 1 oz (57.2 kg)    Physical Exam  Constitutional: She is oriented to person, place, and time. She appears well-developed and well-nourished. No distress.  HENT:  Head: Normocephalic and atraumatic.    Right Ear: Hearing normal.  Left Ear: Hearing normal.  Nose: Nose normal.  Eyes: Conjunctivae and lids are normal. Right eye exhibits no discharge. Left eye exhibits no discharge. No scleral icterus.  Pulmonary/Chest: Effort normal. No respiratory distress.  Abdominal: Soft. Bowel sounds are normal. She exhibits no distension and no mass. There is no tenderness. There is no rebound and no guarding. No hernia.  Musculoskeletal: Normal range of motion.  Neurological: She is alert and oriented to person, place, and time.  Skin: Skin is warm, dry and intact. Capillary refill takes less than 2 seconds. No rash noted. She is not diaphoretic. No erythema. No pallor.  Psychiatric: She has a normal mood and affect. Her speech is normal and behavior is normal. Judgment and thought content normal. Cognition and memory are normal.  Nursing note and vitals reviewed.   Results for orders placed or performed in visit on 02/02/18  CBC with Differential/Platelet  Result Value Ref Range   WBC 4.6 3.4 - 10.8 x10E3/uL   RBC 4.55 3.77 - 5.28 x10E6/uL   Hemoglobin 12.3 11.1 - 15.9 g/dL   Hematocrit 11.9 14.7 - 46.6 %   MCV 85 79 - 97 fL   MCH 27.0 26.6 - 33.0 pg   MCHC 31.9 31.5 - 35.7 g/dL   RDW 82.9 56.2 - 13.0 %   Platelets 267 150 - 450 x10E3/uL   Neutrophils 36 Not Estab. %   Lymphs 50 Not Estab. %   Monocytes 11 Not Estab. %  Eos 2 Not Estab. %   Basos 1 Not Estab. %   Neutrophils Absolute 1.7 1.4 - 7.0 x10E3/uL   Lymphocytes Absolute 2.3 0.7 - 3.1 x10E3/uL   Monocytes Absolute 0.5 0.1 - 0.9 x10E3/uL   EOS (ABSOLUTE) 0.1 0.0 - 0.4 x10E3/uL   Basophils Absolute 0.0 0.0 - 0.2 x10E3/uL   Immature Granulocytes 0 Not Estab. %   Immature Grans (Abs) 0.0 0.0 - 0.1 x10E3/uL  Comprehensive metabolic panel  Result Value Ref Range   Glucose 88 65 - 99 mg/dL   BUN 6 6 - 20 mg/dL   Creatinine, Ser 1.61 0.57 - 1.00 mg/dL   GFR calc non Af Amer 104 >59 mL/min/1.73   GFR calc Af Amer 120 >59  mL/min/1.73   BUN/Creatinine Ratio 8 (L) 9 - 23   Sodium 141 134 - 144 mmol/L   Potassium 4.7 3.5 - 5.2 mmol/L   Chloride 105 96 - 106 mmol/L   CO2 26 20 - 29 mmol/L   Calcium 9.7 8.7 - 10.2 mg/dL   Total Protein 7.5 6.0 - 8.5 g/dL   Albumin 4.6 3.5 - 5.5 g/dL   Globulin, Total 2.9 1.5 - 4.5 g/dL   Albumin/Globulin Ratio 1.6 1.2 - 2.2   Bilirubin Total 0.3 0.0 - 1.2 mg/dL   Alkaline Phosphatase 50 39 - 117 IU/L   AST 19 0 - 40 IU/L   ALT 12 0 - 32 IU/L  Lipid Panel w/o Chol/HDL Ratio  Result Value Ref Range   Cholesterol, Total 177 100 - 199 mg/dL   Triglycerides 78 0 - 149 mg/dL   HDL 65 >09 mg/dL   VLDL Cholesterol Cal 16 5 - 40 mg/dL   LDL Calculated 96 0 - 99 mg/dL  TSH  Result Value Ref Range   TSH 1.240 0.450 - 4.500 uIU/mL  UA/M w/rflx Culture, Routine  Result Value Ref Range   Specific Gravity, UA 1.015 1.005 - 1.030   pH, UA 7.0 5.0 - 7.5   Color, UA Yellow Yellow   Appearance Ur Clear Clear   Leukocytes, UA Negative Negative   Protein, UA Negative Negative/Trace   Glucose, UA Negative Negative   Ketones, UA Negative Negative   RBC, UA Negative Negative   Bilirubin, UA Negative Negative   Urobilinogen, Ur 1.0 0.2 - 1.0 mg/dL   Nitrite, UA Negative Negative      Assessment & Plan:   Problem List Items Addressed This Visit    None    Visit Diagnoses    BV (bacterial vaginosis)    -  Primary   Will treat with flagyl. Call if not getting better or getting worse.    Relevant Medications   mupirocin ointment (BACTROBAN) 2 %   metroNIDAZOLE (FLAGYL) 500 MG tablet   Vaginal discharge       Checking wet prep. Patient self-swabbed today. + clue cells   Relevant Orders   WET PREP FOR TRICH, YEAST, CLUE       Follow up plan: Return if symptoms worsen or fail to improve.

## 2018-06-14 ENCOUNTER — Other Ambulatory Visit: Payer: Self-pay

## 2018-06-14 MED ORDER — MUPIROCIN 2 % EX OINT: TOPICAL_OINTMENT | CUTANEOUS | 0 refills | Status: DC

## 2018-06-21 ENCOUNTER — Other Ambulatory Visit: Payer: Self-pay | Admitting: Family Medicine

## 2018-06-21 MED ORDER — NYSTATIN 100000 UNIT/ML MT SUSP: 5.0000 mL | Freq: Four times a day (QID) | OROMUCOSAL | 0 refills | Status: DC

## 2018-06-27 ENCOUNTER — Ambulatory Visit
Admission: EM | Admit: 2018-06-27 | Discharge: 2018-06-27 | Disposition: A | Discharge status: Home / Self Care | Payer: 59

## 2018-07-21 ENCOUNTER — Other Ambulatory Visit: Payer: Self-pay | Admitting: Family Medicine

## 2018-07-30 ENCOUNTER — Other Ambulatory Visit: Payer: Self-pay

## 2018-07-30 ENCOUNTER — Encounter: Payer: Self-pay | Admitting: Family Medicine

## 2018-07-30 ENCOUNTER — Ambulatory Visit (INDEPENDENT_AMBULATORY_CARE_PROVIDER_SITE_OTHER): Payer: 59 | Admitting: Family Medicine

## 2018-07-30 VITALS — BP 109/72 | HR 99 | Temp 98.4°F | Ht 68.0 in | Wt 126.4 lb

## 2018-07-30 DIAGNOSIS — J301 Allergic rhinitis due to pollen: Secondary | ICD-10-CM

## 2018-07-30 DIAGNOSIS — J028 Acute pharyngitis due to other specified organisms: Secondary | ICD-10-CM

## 2018-07-30 MED ORDER — FLUTICASONE PROPIONATE 50 MCG/ACT NA SUSP: 1.0000 | Freq: Two times a day (BID) | NASAL | 6 refills | Status: DC

## 2018-07-30 MED ORDER — AMOXICILLIN 875 MG PO TABS: 875.0000 mg | ORAL_TABLET | Freq: Two times a day (BID) | ORAL | 0 refills | Status: DC

## 2018-07-30 MED ORDER — FLUCONAZOLE 150 MG PO TABS: 150.0000 mg | ORAL_TABLET | Freq: Once | ORAL | 0 refills | Status: AC

## 2018-07-30 NOTE — Progress Notes (Signed)
BP 109/72   Pulse 99   Temp 98.4 F (36.9 C) (Oral)   Ht 5\' 8"  (1.727 m)   Wt 126 lb 7 oz (57.4 kg)   LMP 02/25/2017   BMI 19.22 kg/m    Subjective:    Patient ID: Courtney Rivers, female    DOB: 01/11/1986, 32 y.o.   MRN: [id number]  HPI: [name] is a 32 y.o. female  Chief Complaint  Patient presents with  . Sore Throat   Here today with nearly 2 weeks of significant sore, swollen throat, fatigue, possibly some low grade fevers. Has been dealing with some congestion, sinus drainage, and post nasal drainage from her allergies for weeks now but this feels like something different per patient. Has not been trying anything OTC, just taking singulair for her allergies. Several sick contacts at home.   Relevant past medical, surgical, family and social history reviewed and updated as indicated. Interim medical history since our last visit reviewed. Allergies and medications reviewed and updated.  Review of Systems  Per HPI unless specifically indicated above     Objective:    BP 109/72   Pulse 99   Temp 98.4 F (36.9 C) (Oral)   Ht 5\' 8"  (1.727 m)   Wt 126 lb 7 oz (57.4 kg)   LMP 02/25/2017   BMI 19.22 kg/m   Wt Readings from Last 3 Encounters:  07/30/18 126 lb 7 oz (57.4 kg)  06/08/18 128 lb 4 oz (58.2 kg)  06/01/18 126 lb 4.8 oz (57.3 kg)    Physical Exam Vitals signs and nursing note reviewed.  Constitutional:      Appearance: Normal appearance.  HENT:     Head: Atraumatic.     Right Ear: Tympanic membrane and external ear normal.     Left Ear: Tympanic membrane and external ear normal.     Nose:     Comments: Significant nasal mucosal edema and erythema b/l     Mouth/Throat:     Mouth: Mucous membranes are moist.     Pharynx: Posterior oropharyngeal erythema present.     Comments: B/l tonsillar edema, cryptic appearance Eyes:     Extraocular Movements: Extraocular movements intact.     Conjunctiva/sclera: Conjunctivae normal.  Neck:      Musculoskeletal: Normal range of motion and neck supple.  Cardiovascular:     Rate and Rhythm: Normal rate and regular rhythm.     Heart sounds: Normal heart sounds.  Pulmonary:     Effort: Pulmonary effort is normal.     Breath sounds: Normal breath sounds. No wheezing.  Musculoskeletal: Normal range of motion.  Lymphadenopathy:     Cervical: Cervical adenopathy present.  Skin:    General: Skin is warm and dry.  Neurological:     Mental Status: She is alert and oriented to person, place, and time.  Psychiatric:        Mood and Affect: Mood normal.        Thought Content: Thought content normal.     Results for orders placed or performed in visit on 06/08/18  WET PREP FOR TRICH, YEAST, CLUE  Result Value Ref Range   Trichomonas Exam Negative Negative   Yeast Exam Negative Negative   Clue Cell Exam Positive (A) Negative      Assessment & Plan:   Problem List Items Addressed This Visit      Respiratory   Rhinitis, allergic    Not under good control. Will add antihistamine and  BID flonase. Discussed proper use of flonase as she's had nosebleeds in the past. Continue zyrtec, humidifier.        Other Visit Diagnoses    Pharyngitis due to other organism    -  Primary   Given severity, tonsillar appearance, and duration will tx with amoxil. Diflucan sent for yeast infection from abx       Follow up plan: Return for as scheduled.

## 2018-08-01 NOTE — Assessment & Plan Note (Signed)
Not under good control. Will add antihistamine and BID flonase. Discussed proper use of flonase as she's had nosebleeds in the past. Continue zyrtec, humidifier.

## 2018-10-13 ENCOUNTER — Ambulatory Visit (INDEPENDENT_AMBULATORY_CARE_PROVIDER_SITE_OTHER): Payer: 59 | Admitting: Family Medicine

## 2018-10-13 ENCOUNTER — Other Ambulatory Visit: Payer: Self-pay | Admitting: Family Medicine

## 2018-10-13 ENCOUNTER — Encounter: Payer: Self-pay | Admitting: Family Medicine

## 2018-10-13 VITALS — BP 107/72 | HR 99 | Temp 98.6°F | Ht 68.0 in | Wt 127.3 lb

## 2018-10-13 DIAGNOSIS — J029 Acute pharyngitis, unspecified: Secondary | ICD-10-CM

## 2018-10-13 MED ORDER — AMOXICILLIN 875 MG PO TABS: 875.0000 mg | ORAL_TABLET | Freq: Two times a day (BID) | ORAL | 0 refills | Status: DC

## 2018-10-13 NOTE — Progress Notes (Signed)
BP 107/72   Pulse 99   Temp 98.6 F (37 C) (Oral)   Ht 5\' 8"  (1.727 m)   Wt 127 lb 4.8 oz (57.7 kg)   LMP 02/25/2017   SpO2 99%   BMI 19.36 kg/m    Subjective:    Patient ID: Courtney Rivers, female    DOB: [birth year], 33 y.o.   MRN: [id number]  HPI: Courtney Rivers is a 33 y.o. female  Chief Complaint  Patient presents with  . Sore Throat    since saturday, hurts to swallow worse in the A.M  . Nasal Congestion    Worse in the A.M  . Cough    Slight  . Ear Pain    Right ear, Started yesterday   Here today with sore, swollen throat worsening for 6 days. Having a very mild cough and congestion, and now some right ear pain for 1 day but the sore throat is the main complaint. No fevers, chills, body aches. Lots of sick contacts. Taking flonase and singulair daily for allergic rhinitis and mucinex cold and sinus without relief of current sxs.   Relevant past medical, surgical, family and social history reviewed and updated as indicated. Interim medical history since our last visit reviewed. Allergies and medications reviewed and updated.  Review of Systems  Per HPI unless specifically indicated above     Objective:    BP 107/72   Pulse 99   Temp 98.6 F (37 C) (Oral)   Ht 5\' 8"  (1.727 m)   Wt 127 lb 4.8 oz (57.7 kg)   LMP 02/25/2017   SpO2 99%   BMI 19.36 kg/m   Wt Readings from Last 3 Encounters:  10/13/18 127 lb 4.8 oz (57.7 kg)  07/30/18 126 lb 7 oz (57.4 kg)  06/08/18 128 lb 4 oz (58.2 kg)    Physical Exam Vitals signs and nursing note reviewed.  Constitutional:      Appearance: Normal appearance.  HENT:     Head: Atraumatic.     Right Ear: Tympanic membrane and external ear normal.     Left Ear: Tympanic membrane and external ear normal.     Nose: Congestion present.     Mouth/Throat:     Mouth: Mucous membranes are moist.     Pharynx: Posterior oropharyngeal erythema present.     Comments: Cryptic enlarged tonsils b/l Eyes:   Extraocular Movements: Extraocular movements intact.     Conjunctiva/sclera: Conjunctivae normal.  Neck:     Musculoskeletal: Normal range of motion and neck supple.  Cardiovascular:     Rate and Rhythm: Normal rate and regular rhythm.     Heart sounds: Normal heart sounds.  Pulmonary:     Effort: Pulmonary effort is normal.     Breath sounds: Normal breath sounds. No wheezing or rales.  Musculoskeletal: Normal range of motion.  Skin:    General: Skin is warm and dry.  Neurological:     Mental Status: She is alert and oriented to person, place, and time.  Psychiatric:        Mood and Affect: Mood normal.        Thought Content: Thought content normal.     Results for orders placed or performed in visit on 10/13/18  Rapid Strep Screen ([facility] ONLY)  Result Value Ref Range   Strep Gp A Ag, IA W/Reflex Negative Negative  Culture, Group A Strep  Result Value Ref Range   Strep A Culture WILL FOLLOW  Assessment & Plan:   Problem List Items Addressed This Visit    None    Visit Diagnoses    Pharyngitis, unspecified etiology    -  Primary   Rapid strep neg, but sxs consistent and ongoing for almost a week now. Will tx with amoxil and continue supportive care. Await strep culture   Relevant Orders   Rapid Strep Screen ([facility] ONLY) (Completed)       Follow up plan: Return if symptoms worsen or fail to improve.

## 2018-10-15 ENCOUNTER — Other Ambulatory Visit: Payer: Self-pay

## 2018-10-15 MED ORDER — ONDANSETRON 4 MG PO TBDP: 4.0000 mg | ORAL_TABLET | Freq: Three times a day (TID) | ORAL | 3 refills | Status: DC | PRN

## 2018-10-16 LAB — RAPID STREP SCREEN (MED CTR MEBANE ONLY): Strep Gp A Ag, IA W/Reflex: NEGATIVE

## 2018-10-16 LAB — CULTURE, GROUP A STREP: STREP A CULTURE: NEGATIVE

## 2018-10-26 ENCOUNTER — Ambulatory Visit (INDEPENDENT_AMBULATORY_CARE_PROVIDER_SITE_OTHER): Payer: 59 | Admitting: Family Medicine

## 2018-10-26 ENCOUNTER — Other Ambulatory Visit: Payer: Self-pay

## 2018-10-26 ENCOUNTER — Encounter: Payer: Self-pay | Admitting: Family Medicine

## 2018-10-26 VITALS — BP 106/73 | HR 74 | Temp 98.5°F | Wt 128.3 lb

## 2018-10-26 DIAGNOSIS — M545 Low back pain, unspecified: Secondary | ICD-10-CM

## 2018-10-26 DIAGNOSIS — M9904 Segmental and somatic dysfunction of sacral region: Secondary | ICD-10-CM

## 2018-10-26 DIAGNOSIS — M9901 Segmental and somatic dysfunction of cervical region: Secondary | ICD-10-CM

## 2018-10-26 DIAGNOSIS — M9903 Segmental and somatic dysfunction of lumbar region: Secondary | ICD-10-CM | POA: Diagnosis not present

## 2018-10-26 DIAGNOSIS — M9902 Segmental and somatic dysfunction of thoracic region: Secondary | ICD-10-CM | POA: Diagnosis not present

## 2018-10-26 DIAGNOSIS — M9908 Segmental and somatic dysfunction of rib cage: Secondary | ICD-10-CM | POA: Diagnosis not present

## 2018-10-26 DIAGNOSIS — M9905 Segmental and somatic dysfunction of pelvic region: Secondary | ICD-10-CM

## 2018-10-26 DIAGNOSIS — M9900 Segmental and somatic dysfunction of head region: Secondary | ICD-10-CM | POA: Diagnosis not present

## 2018-10-28 ENCOUNTER — Encounter: Payer: Self-pay | Admitting: Family Medicine

## 2018-10-28 NOTE — Progress Notes (Signed)
BP 106/73    Pulse 74    Temp 98.5 F (36.9 C) (Oral)    Wt 128 lb 4.8 oz (58.2 kg)    LMP 02/25/2017    SpO2 98%    BMI 19.51 kg/m    Subjective:    Patient ID: Courtney Rivers, female    DOB: February 18, 1986, 33 y.o.   MRN: [id number]  HPI: Courtney Rivers is a 33 y.o. female  Chief Complaint  Patient presents with   Back Pain   Courtney Rivers presents today for evaluation and possible treatment with OMT for low back pain. She notes that she has been sitting more and that her sacrum and low back have been hurting. It's been going on for the last 3-4 days. Better with standing and worse with sitting and walking. Pain does not radiate. It is aching and sore in nature. She is otherwise feeling well with no other concerns or complaints at thist ime.   Relevant past medical, surgical, family and social history reviewed and updated as indicated. Interim medical history since our last visit reviewed. Allergies and medications reviewed and updated.  Review of Systems  Constitutional: Negative.   Respiratory: Negative.   Cardiovascular: Negative.   Musculoskeletal: Positive for back pain, gait problem and myalgias. Negative for arthralgias, joint swelling, neck pain and neck stiffness.  Skin: Negative.   Psychiatric/Behavioral: Negative.     Per HPI unless specifically indicated above     Objective:    BP 106/73    Pulse 74    Temp 98.5 F (36.9 C) (Oral)    Wt 128 lb 4.8 oz (58.2 kg)    LMP 02/25/2017    SpO2 98%    BMI 19.51 kg/m   Wt Readings from Last 3 Encounters:  10/26/18 128 lb 4.8 oz (58.2 kg)  10/13/18 127 lb 4.8 oz (57.7 kg)  07/30/18 126 lb 7 oz (57.4 kg)    Physical Exam Vitals signs and nursing note reviewed.  Constitutional:      General: She is not in acute distress.    Appearance: Normal appearance. She is not ill-appearing, toxic-appearing or diaphoretic.  HENT:     Head: Normocephalic and atraumatic.     Right Ear: External ear normal.     Left Ear:  External ear normal.     Nose: Nose normal.     Mouth/Throat:     Mouth: Mucous membranes are moist.     Pharynx: Oropharynx is clear.  Eyes:     General: No scleral icterus.       Right eye: No discharge.        Left eye: No discharge.     Extraocular Movements: Extraocular movements intact.     Conjunctiva/sclera: Conjunctivae normal.     Pupils: Pupils are equal, round, and reactive to light.  Neck:     Musculoskeletal: Normal range of motion and neck supple. No muscular tenderness.     Vascular: No carotid bruit.  Cardiovascular:     Rate and Rhythm: Normal rate and regular rhythm.     Pulses: Normal pulses.     Heart sounds: Normal heart sounds. No murmur. No friction rub. No gallop.   Pulmonary:     Effort: Pulmonary effort is normal. No respiratory distress.     Breath sounds: Normal breath sounds. No stridor. No wheezing, rhonchi or rales.  Chest:     Chest wall: No tenderness.  Abdominal:     General: Abdomen is flat. There is  no distension.     Palpations: Abdomen is soft. There is no mass.     Tenderness: There is no abdominal tenderness. There is no right CVA tenderness, left CVA tenderness, guarding or rebound.     Hernia: No hernia is present.  Lymphadenopathy:     Cervical: No cervical adenopathy.  Skin:    General: Skin is warm and dry.     Capillary Refill: Capillary refill takes less than 2 seconds.     Coloration: Skin is not jaundiced or pale.     Findings: No bruising, erythema, lesion or rash.  Neurological:     General: No focal deficit present.     Mental Status: She is alert and oriented to person, place, and time. Mental status is at baseline.  Psychiatric:        Mood and Affect: Mood normal.        Behavior: Behavior normal.        Thought Content: Thought content normal.        Judgment: Judgment normal.   Musculoskeletal:  Exam found Decreased ROM, Tissue texture changes, Tenderness to palpation and Asymmetry of patient's  head, neck, thorax,  ribs, lumbar, pelvis and sacrum Osteopathic Structural Exam:   Head: hypertonic suboccipital muscles. OAESSR  Neck: C3-4ESRR, C5ESRL  Thorax: T3-5SLRR  Ribs: Ribs 6-9 locked up on the L  Lumbar: L1-2SLRR, L3-5SRRL  Pelvis: Posterior R innominate  Sacrum: R on R torsion   Results for orders placed or performed in visit on 10/13/18  Rapid Strep Screen ([facility] ONLY)  Result Value Ref Range   Strep Gp A Ag, IA W/Reflex Negative Negative  Culture, Group A Strep  Result Value Ref Range   Strep A Culture Negative       Assessment & Plan:   Problem List Items Addressed This Visit    None    Visit Diagnoses    Acute left-sided low back pain without sciatica    -  Primary   In acute exacerbation. She has some somatic dysfunction that is contributing. She would benefit from OMT- treated today with good results as below.    Cervical segment dysfunction       Rib cage region somatic dysfunction       Lumbar region somatic dysfunction       Sacral region somatic dysfunction       Pelvic somatic dysfunction       Head region somatic dysfunction       Thoracic segment dysfunction         After verbal consent was obtained, patient was treated today with osteopathic manipulative medicine to the regions of the head, neck, thorax, ribs, lumbar, pelvis and sacrum using the techniques of myofascial release, counterstrain, muscle energy, HVLA and soft tissue. Areas of compensation relating to her primary pain source also treated. Patient tolerated the procedure well with good objective and good subjective improvement in symptoms. She left the room in good condition. She was advised to stay well hydrated and that she may have some soreness following the procedure. If not improving or worsening, she will call and come in. She will return for reevaluation  on a PRN basis.   Follow up plan: Return if symptoms worsen or fail to improve.

## 2018-11-03 ENCOUNTER — Ambulatory Visit (INDEPENDENT_AMBULATORY_CARE_PROVIDER_SITE_OTHER): Payer: 59 | Admitting: Family Medicine

## 2018-11-03 ENCOUNTER — Encounter: Payer: Self-pay | Admitting: Family Medicine

## 2018-11-03 VITALS — BP 102/70 | HR 74 | Temp 98.8°F | Ht 68.0 in | Wt 126.2 lb

## 2018-11-03 DIAGNOSIS — F331 Major depressive disorder, recurrent, moderate: Secondary | ICD-10-CM

## 2018-11-03 DIAGNOSIS — N809 Endometriosis, unspecified: Secondary | ICD-10-CM | POA: Diagnosis not present

## 2018-11-03 DIAGNOSIS — K219 Gastro-esophageal reflux disease without esophagitis: Secondary | ICD-10-CM | POA: Insufficient documentation

## 2018-11-03 DIAGNOSIS — N8003 Adenomyosis of the uterus: Secondary | ICD-10-CM

## 2018-11-03 DIAGNOSIS — J301 Allergic rhinitis due to pollen: Secondary | ICD-10-CM

## 2018-11-03 DIAGNOSIS — J029 Acute pharyngitis, unspecified: Secondary | ICD-10-CM

## 2018-11-03 DIAGNOSIS — F411 Generalized anxiety disorder: Secondary | ICD-10-CM | POA: Insufficient documentation

## 2018-11-03 DIAGNOSIS — N800 Endometriosis of uterus: Secondary | ICD-10-CM

## 2018-11-03 MED ORDER — BUSPIRONE HCL 5 MG PO TABS: 5.0000 mg | ORAL_TABLET | Freq: Two times a day (BID) | ORAL | 3 refills | Status: DC

## 2018-11-03 MED ORDER — LEVOCETIRIZINE DIHYDROCHLORIDE 5 MG PO TABS: 5.0000 mg | ORAL_TABLET | Freq: Every evening | ORAL | 2 refills | Status: DC

## 2018-11-03 NOTE — Assessment & Plan Note (Signed)
Chronic and uncontrolled Recent worsening due to stress at work due to pandemic as well as children being home from school due to pandemic and separation from her husband This is more uncontrolled than her depression at this time We will continue her Effexor, Seroquel, prazosin We will add BuSpar 5 mg twice daily Discussed that we can increase this dosing to 3 times daily if she is tolerating it well and finds that she needs an extra dose in the middle of the day Plan to follow-up in 6 to 8 weeks and reassess

## 2018-11-03 NOTE — Assessment & Plan Note (Signed)
Chronic and stable Some worsening recently due to pandemic and stress at work She is aware of counseling available through work We will continue her Effexor, Seroquel, prazosin at current doses Discussed that because her Effexor is XR, it does not matter what time of day that she takes this dose She should continue Seroquel and prazosin at bedtime however As her anxiety is worse than her depression currently, we will add BuSpar as below

## 2018-11-03 NOTE — Assessment & Plan Note (Signed)
Followed by GYN S/p hysterectomy No further issues

## 2018-11-03 NOTE — Assessment & Plan Note (Signed)
Chronic Suspect this is either 2/2 allergic rhinitis with postnasal drip or silent GERD We will first start antihistamine in addition to her Singulair If this is not improving things, she will also add a PPI We will follow-up in 6 to 8 weeks to see if this is improving May need to consider ENT referral in the future

## 2018-11-03 NOTE — Assessment & Plan Note (Signed)
Uncontrolled Suspect this contributes to nasal sores and nosebleeds in the past We discussed proper use of Flonase, but she is unable to tolerate this point detail sores in her nasal passages She can continue Bactroban as needed for the sores Continue Singulair and add OTC antihistamine Discussed humidifier use as well Suspect this may contribute to her chronic pharyngitis which may be from postnasal drip
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
## 2018-11-03 NOTE — Assessment & Plan Note (Signed)
It is noted in the patient's chart that she has a history of GERD.  She does not remember ever taking medication for this.  She denies any symptoms Her pharyngitis that is worse in the morning may be related to silent reflux

## 2018-11-29 ENCOUNTER — Other Ambulatory Visit: Payer: Self-pay

## 2018-11-29 MED ORDER — FLUCONAZOLE 150 MG PO TABS: 150.0000 mg | ORAL_TABLET | Freq: Once | ORAL | 0 refills | Status: AC

## 2019-02-10 ENCOUNTER — Other Ambulatory Visit: Payer: Self-pay

## 2019-02-10 ENCOUNTER — Encounter: Payer: Self-pay | Admitting: Obstetrics & Gynecology

## 2019-02-10 ENCOUNTER — Ambulatory Visit (INDEPENDENT_AMBULATORY_CARE_PROVIDER_SITE_OTHER): Payer: Medicaid Other | Admitting: Obstetrics & Gynecology

## 2019-02-10 VITALS — BP 100/60 | Ht 69.0 in | Wt 127.0 lb

## 2019-02-10 DIAGNOSIS — R3989 Other symptoms and signs involving the genitourinary system: Secondary | ICD-10-CM | POA: Diagnosis not present

## 2019-02-10 DIAGNOSIS — N8110 Cystocele, unspecified: Secondary | ICD-10-CM

## 2019-02-10 NOTE — Patient Instructions (Signed)
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
## 2019-02-12 LAB — URINE CULTURE: Organism ID, Bacteria: NO GROWTH

## 2019-02-17 ENCOUNTER — Encounter: Payer: 59 | Admitting: Family Medicine

## 2019-02-28 ENCOUNTER — Telehealth: Payer: Self-pay | Admitting: Family Medicine

## 2019-02-28 NOTE — Telephone Encounter (Signed)
Pt wanting to make her 7-24  @ 2 pm visit with Dr. B a Doxy.me visit.  Thanks, [employer]

## 2019-03-03 NOTE — Telephone Encounter (Signed)
Appointment already changed.

## 2019-03-03 NOTE — Progress Notes (Signed)
Patient: Courtney Rivers Female    DOB: 12/14/1985   33 y.o.   MRN: [id number] Visit Date: 03/04/2019  Today's Provider: Shirlee LatchAngela , MD   Chief Complaint  Patient presents with  . Anxiety   Subjective:    Virtual Visit via Video Note  I connected with Courtney Rivers on 03/04/19 at 10:20 AM EDT by a video enabled telemedicine application and verified that I am speaking with the correct person using two identifiers.   Patient location: home Provider location: home office Persons involved in the visit: patient, provider    I discussed the limitations of evaluation and management by telemedicine and the availability of in person appointments. The patient expressed understanding and agreed to proceed.  HPI  Anxiety  Patient presents today for anxiety follow-up.  Was last seen 11/03/18. Effexor, seroquel, prazosin was continued. Started buspar 5mg  BID at that time.  Despite the stressors of the pandemic  Was going through a separation when we last talked and she has moved out and divorce papers are being signed.  She is feeling better than she has in 10 yrs.  Did not notice a difference when taking Buspar.  Feels like heart rate is irregular.  Has some mild chest pain with episodes.  Ongoing for a few months.  Thought it was stress/anxiety, but still happening despite feeling stress free.  Occurs randomly  Wasn't comfortable coming in for an EKG during the pandemic  Depression screen [facility] 2/9 03/04/2019 11/03/2018 11/03/2018 02/02/2018 12/02/2017  Decreased Interest 0 1 1 0 1  Down, Depressed, Hopeless 0 1 2 1 1   PHQ - 2 Score 0 2 3 1 2   Altered sleeping 0 3 2 0 1  Tired, decreased energy 0 3 2 0 0  Change in appetite 3 2 3 1 1   Feeling bad or failure about yourself  1 1 1  0 -  Trouble concentrating 2 1 1  0 0  Moving slowly or fidgety/restless 0 0 2 0 0  Suicidal thoughts 0 0 0 0 0  PHQ-9 Score 6 12 14 2 4   Difficult doing work/chores Somewhat difficult  Somewhat difficult Somewhat difficult - Extremely dIfficult  Some recent data might be hidden   GAD 7 : Generalized Anxiety Score 03/04/2019 11/03/2018 02/02/2018 12/02/2017  Nervous, Anxious, on Edge 2 3 1 3   Control/stop worrying 0 3 0 0  Worry too much - different things 0 3 0 3  Trouble relaxing 0 3 0 1  Restless 0 3 0 0  Easily annoyed or irritable 3 3 1 3   Afraid - awful might happen 0 0 0 3  Total GAD 7 Score 5 18 2 13   Anxiety Difficulty Somewhat difficult - - Extremely difficult     Allergies  Allergen Reactions  . Fluoxetine Other (See Comments)    hallucinations, but tolerated Zoloft in the past  . Penicillins Other (See Comments)    Gets yeast infection Has patient had a PCN reaction causing immediate rash, facial/tongue/throat swelling, SOB or lightheadedness with hypotension: No Has patient had a PCN reaction causing severe rash involving mucus membranes or skin necrosis: No Has patient had a PCN reaction that required hospitalization: No Has patient had a PCN reaction occurring within the last 10 years: No If all of the above answers are "NO", then may proceed with Cephalosporin use.      Current Outpatient Medications:  .  busPIRone (BUSPAR) 5 MG tablet, Take 1 tablet (5 mg  total) by mouth 2 (two) times daily., Disp: 60 tablet, Rfl: 3 .  cyclobenzaprine (FLEXERIL) 10 MG tablet, Take 1 tablet (10 mg total) by mouth at bedtime., Disp: 30 tablet, Rfl: 0 .  levocetirizine (XYZAL) 5 MG tablet, Take 1 tablet (5 mg total) by mouth every evening., Disp: 30 tablet, Rfl: 2 .  montelukast (SINGULAIR) 10 MG tablet, Take 1 tablet (10 mg total) by mouth at bedtime., Disp: 90 tablet, Rfl: 1 .  mupirocin ointment (BACTROBAN) 2 %, PLACE ONE APPLICATION INTO THE NOSE TWO TIMES DAILY, Disp: 22 g, Rfl: 1 .  ondansetron (ZOFRAN-ODT) 4 MG disintegrating tablet, Take 1-2 tablets (4-8 mg total) by mouth every 8 (eight) hours as needed for nausea or vomiting., Disp: 20 tablet, Rfl: 3 .   prazosin (MINIPRESS) 1 MG capsule, Take 1 capsule (1 mg total) by mouth at bedtime., Disp: 90 capsule, Rfl: 3 .  QUEtiapine (SEROQUEL XR) 50 MG TB24 24 hr tablet, Take 1-2 tablets (50-100 mg total) by mouth at bedtime., Disp: 135 tablet, Rfl: 1 .  venlafaxine XR (EFFEXOR XR) 75 MG 24 hr capsule, 2 caps qAM and 1 cap qPM, Disp: 270 capsule, Rfl: 1  Review of Systems  Constitutional: Negative.   Respiratory: Negative.   Genitourinary: Negative.   Hematological: Negative.     Social History   Tobacco Use  . Smoking status: Never Smoker  . Smokeless tobacco: Never Used  Substance Use Topics  . Alcohol use: Yes    Alcohol/week: 1.0 standard drinks    Types: 1 Standard drinks or equivalent per week    Comment: socially, maybe once monthly      Objective:   BP 97/63 (BP Location: Left Arm, Patient Position: Sitting, Cuff Size: Small)   Pulse 90   Temp 98.4 F (36.9 C) (Oral)   Wt 124 lb 12.8 oz (56.6 kg)   LMP 02/25/2017   SpO2 99%   BMI 18.43 kg/m  Vitals:   03/04/19 1020  BP: 97/63  Pulse: 90  Temp: 98.4 F (36.9 C)  TempSrc: Oral  SpO2: 99%  Weight: 124 lb 12.8 oz (56.6 kg)     Physical Exam Vitals signs reviewed.  Constitutional:      Appearance: Normal appearance.  Eyes:     General: No scleral icterus.    Conjunctiva/sclera: Conjunctivae normal.  Pulmonary:     Effort: Pulmonary effort is normal. No respiratory distress.  Neurological:     Mental Status: She is alert and oriented to person, place, and time. Mental status is at baseline.  Psychiatric:        Mood and Affect: Mood normal.        Behavior: Behavior normal.      No results found for any visits on 03/04/19.     Assessment & Plan    I discussed the assessment and treatment plan with the patient. The patient was provided an opportunity to ask questions and all were answered. The patient agreed with the plan and demonstrated an understanding of the instructions.   The patient was advised  to call back or seek an in-person evaluation if the symptoms worsen or if the condition fails to improve as anticipated.  Problem List Items Addressed This Visit      Other   Moderate episode of recurrent major depressive disorder (HCC)    Chronic and well controlled Doing well despite stressors of the pandemic Continue Effexor, seroquel and prazosin at current doses She is aware counseling is available through work  GAD (generalized anxiety disorder) - Primary    Chronic and well controlled D/c buspar as it did not seem helpful Continue effexor, seroquel and prazosin at current doses      Palpitations    New problem Does not seem to be anxiety related as it is not getting better despite anxiety/stress being well controlled Will order Holter monitor Discussed with patient that this is likely SVT, which is benign, but can be triggered by caffeine intake, exercise, stress, etc Further management pending holter results Labs at next in person visit      Relevant Orders   Holter monitor - 48 hour       Return in about 3 months (around 06/04/2019) for CPE.   The entirety of the information documented in the History of Present Illness, Review of Systems and Physical Exam were personally obtained by me. Portions of this information were initially documented by [facility], CMA and reviewed by me for thoroughness and accuracy.    , Dionne Bucy, MD MPH [facility]

## 2019-03-04 ENCOUNTER — Other Ambulatory Visit: Payer: Self-pay

## 2019-03-04 ENCOUNTER — Ambulatory Visit (INDEPENDENT_AMBULATORY_CARE_PROVIDER_SITE_OTHER): Payer: 59 | Admitting: Family Medicine

## 2019-03-04 ENCOUNTER — Encounter: Payer: Self-pay | Admitting: Family Medicine

## 2019-03-04 ENCOUNTER — Ambulatory Visit: Payer: Self-pay | Admitting: Family Medicine

## 2019-03-04 VITALS — BP 97/63 | HR 90 | Temp 98.4°F | Wt 124.8 lb

## 2019-03-04 DIAGNOSIS — F411 Generalized anxiety disorder: Secondary | ICD-10-CM

## 2019-03-04 DIAGNOSIS — R002 Palpitations: Secondary | ICD-10-CM | POA: Insufficient documentation

## 2019-03-04 DIAGNOSIS — F331 Major depressive disorder, recurrent, moderate: Secondary | ICD-10-CM

## 2019-03-04 NOTE — Assessment & Plan Note (Signed)
Chronic and well controlled D/c buspar as it did not seem helpful Continue effexor, seroquel and prazosin at current doses

## 2019-03-04 NOTE — Assessment & Plan Note (Signed)
Chronic and well controlled Doing well despite stressors of the pandemic Continue Effexor, seroquel and prazosin at current doses She is aware counseling is available through work

## 2019-03-04 NOTE — Assessment & Plan Note (Signed)
New problem Does not seem to be anxiety related as it is not getting better despite anxiety/stress being well controlled Will order Holter monitor Discussed with patient that this is likely SVT, which is benign, but can be triggered by caffeine intake, exercise, stress, etc Further management pending holter results Labs at next in person visit

## 2019-03-28 ENCOUNTER — Ambulatory Visit
Admission: RE | Admit: 2019-03-28 | Discharge: 2019-03-28 | Disposition: A | Discharge status: Home / Self Care | Payer: 59 | Source: Ambulatory Visit | Attending: Family Medicine | Admitting: Family Medicine

## 2019-03-28 ENCOUNTER — Other Ambulatory Visit: Payer: Self-pay

## 2019-03-28 DIAGNOSIS — R002 Palpitations: Secondary | ICD-10-CM | POA: Diagnosis not present

## 2019-03-29 IMAGING — DX DG ANKLE COMPLETE 3+V*L*
4 series · 4 of 4 positions shown · non-contrast
Comparison: Left ankle x-rays dated April 24, 2018.

CLINICAL DATA: Left foot and ankle pain after fall.

EXAM:
LEFT ANKLE COMPLETE - 3+ VIEW; LEFT FOOT - COMPLETE 3+ VIEW

[ankle ap]
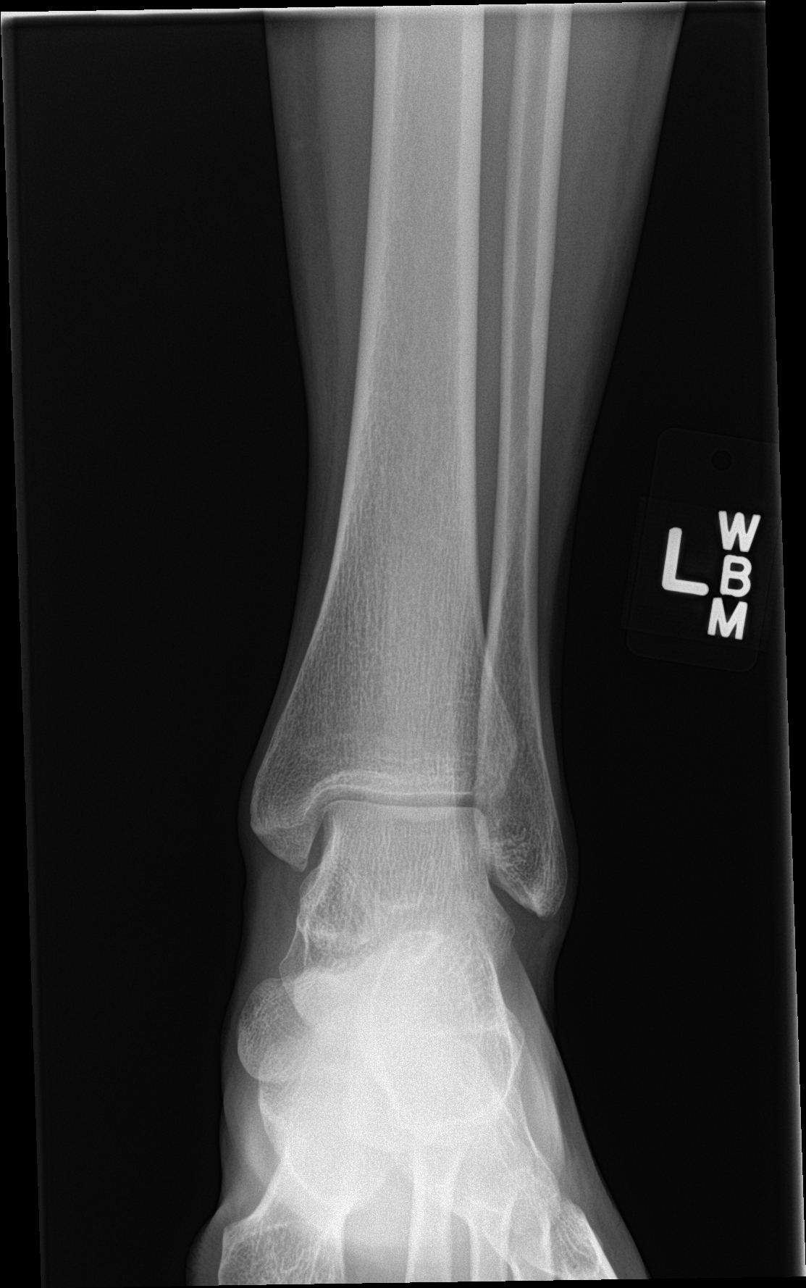

[ankle obl (1 of 2)]
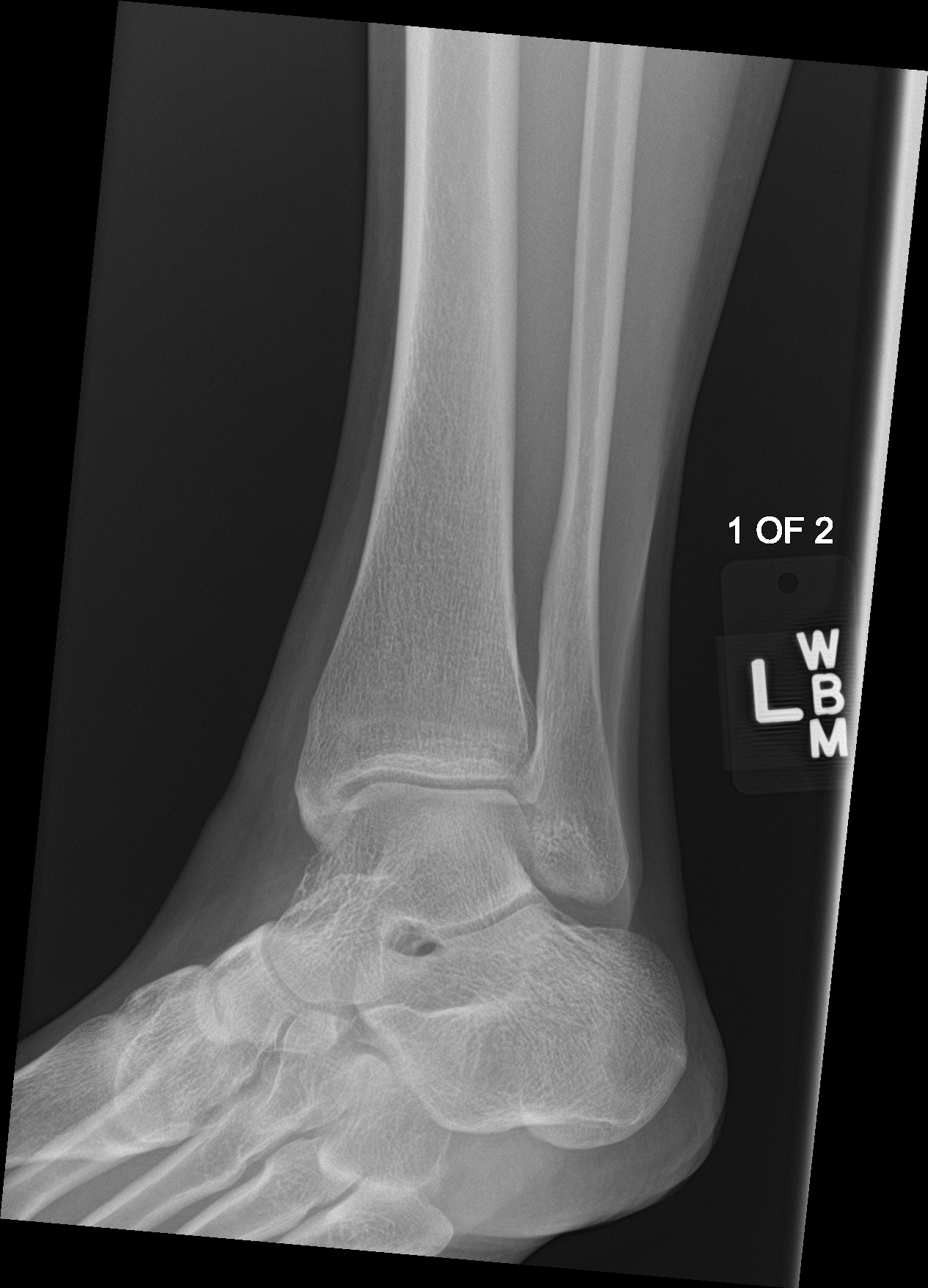

[ankle lat]
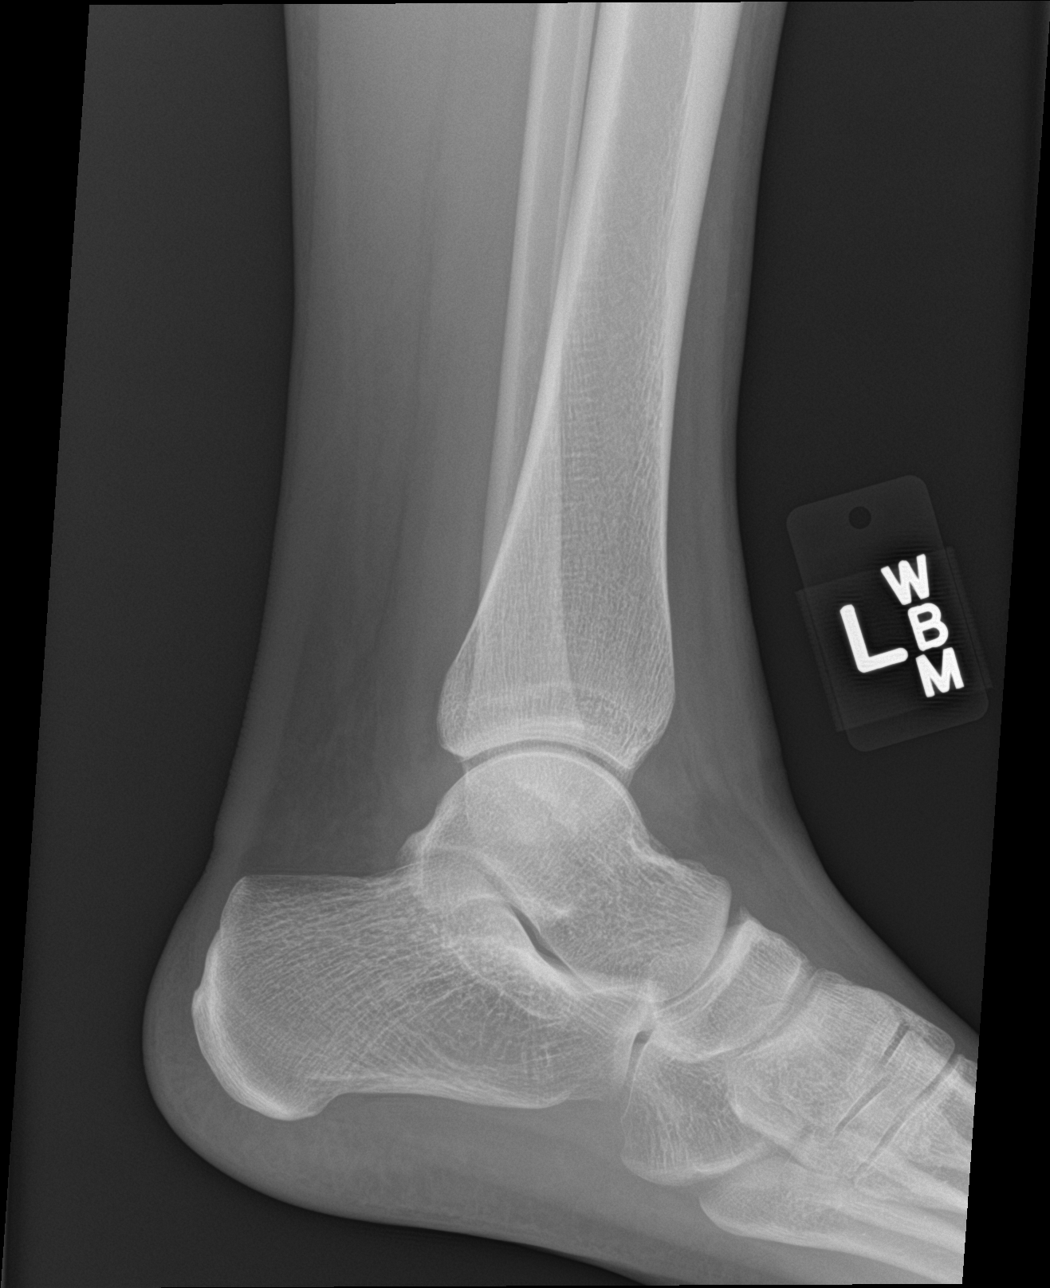

[ankle obl (2 of 2)]
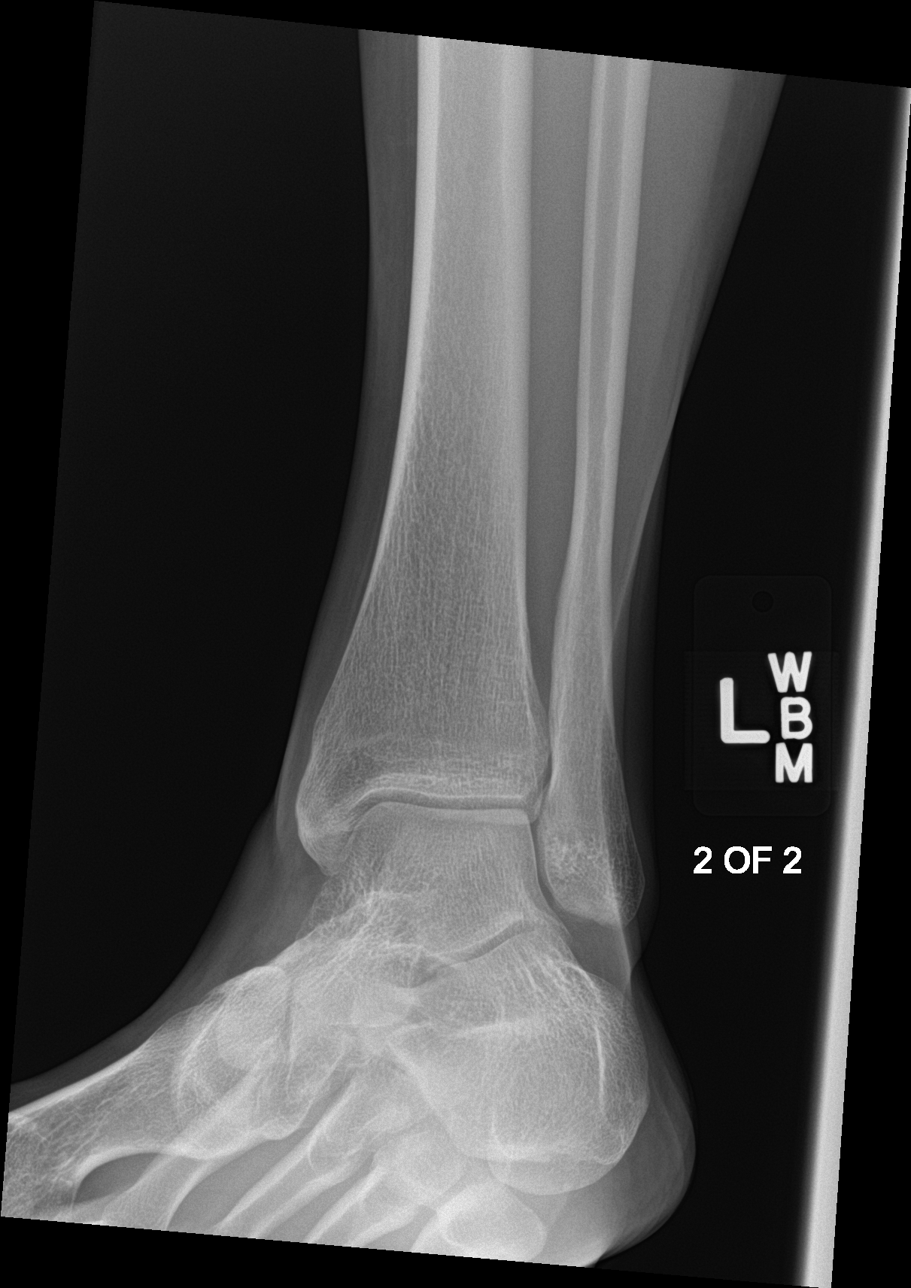

[4 of 4 positions shown; findings below may reference images not displayed]

FINDINGS: No acute fracture or dislocation. The ankle mortise is symmetric.
The talar dome is intact. Small tibiotalar joint effusion. Joint
spaces are preserved. Bone mineralization is normal. Soft tissues
are unremarkable.
IMPRESSION: No acute osseous abnormality of the left foot and ankle.

## 2019-03-29 IMAGING — DX DG FOOT COMPLETE 3+V*L*
3 series · 3 of 3 positions shown · non-contrast
Comparison: Left ankle x-rays dated April 24, 2018.

CLINICAL DATA: Left foot and ankle pain after fall.

EXAM:
LEFT ANKLE COMPLETE - 3+ VIEW; LEFT FOOT - COMPLETE 3+ VIEW

[foot ap]
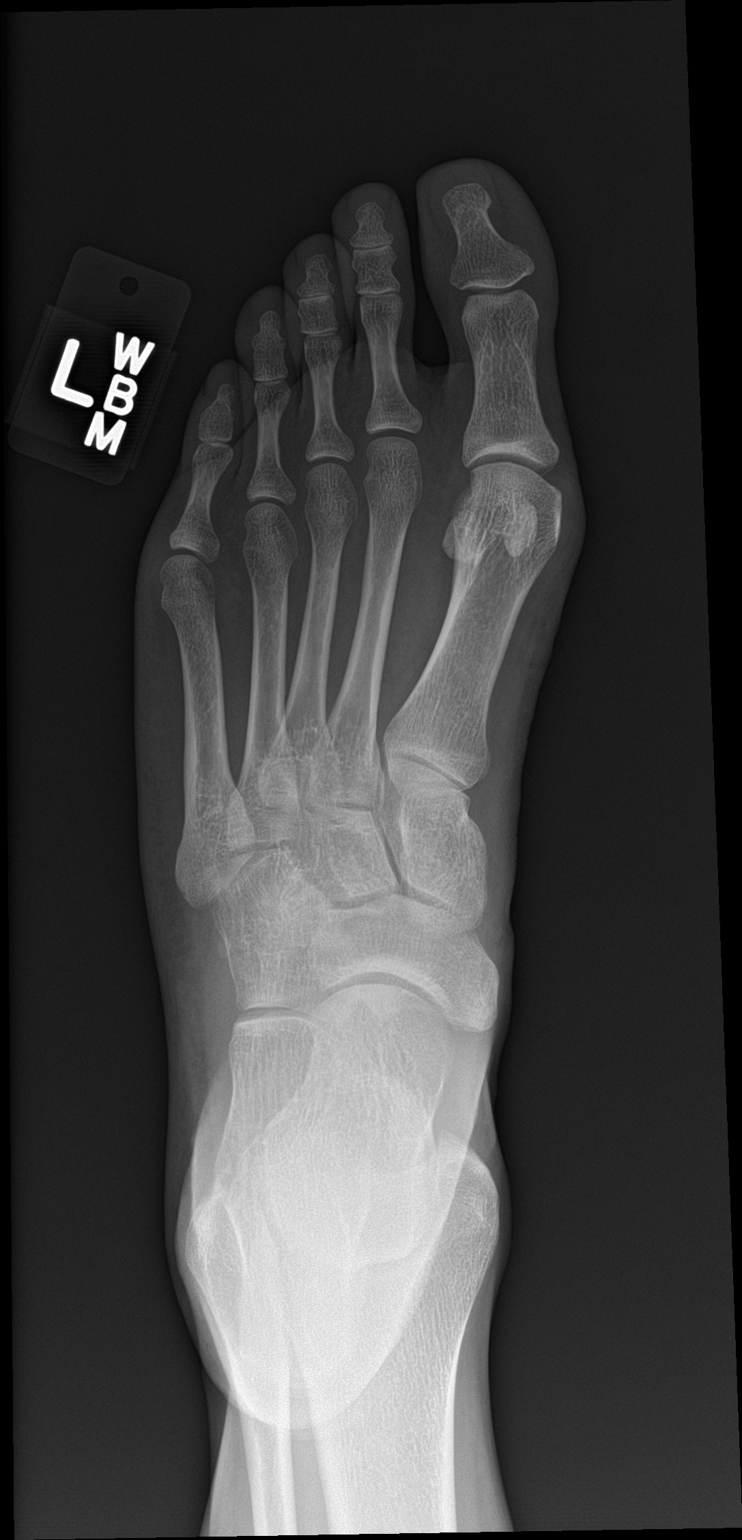

[foot obl]
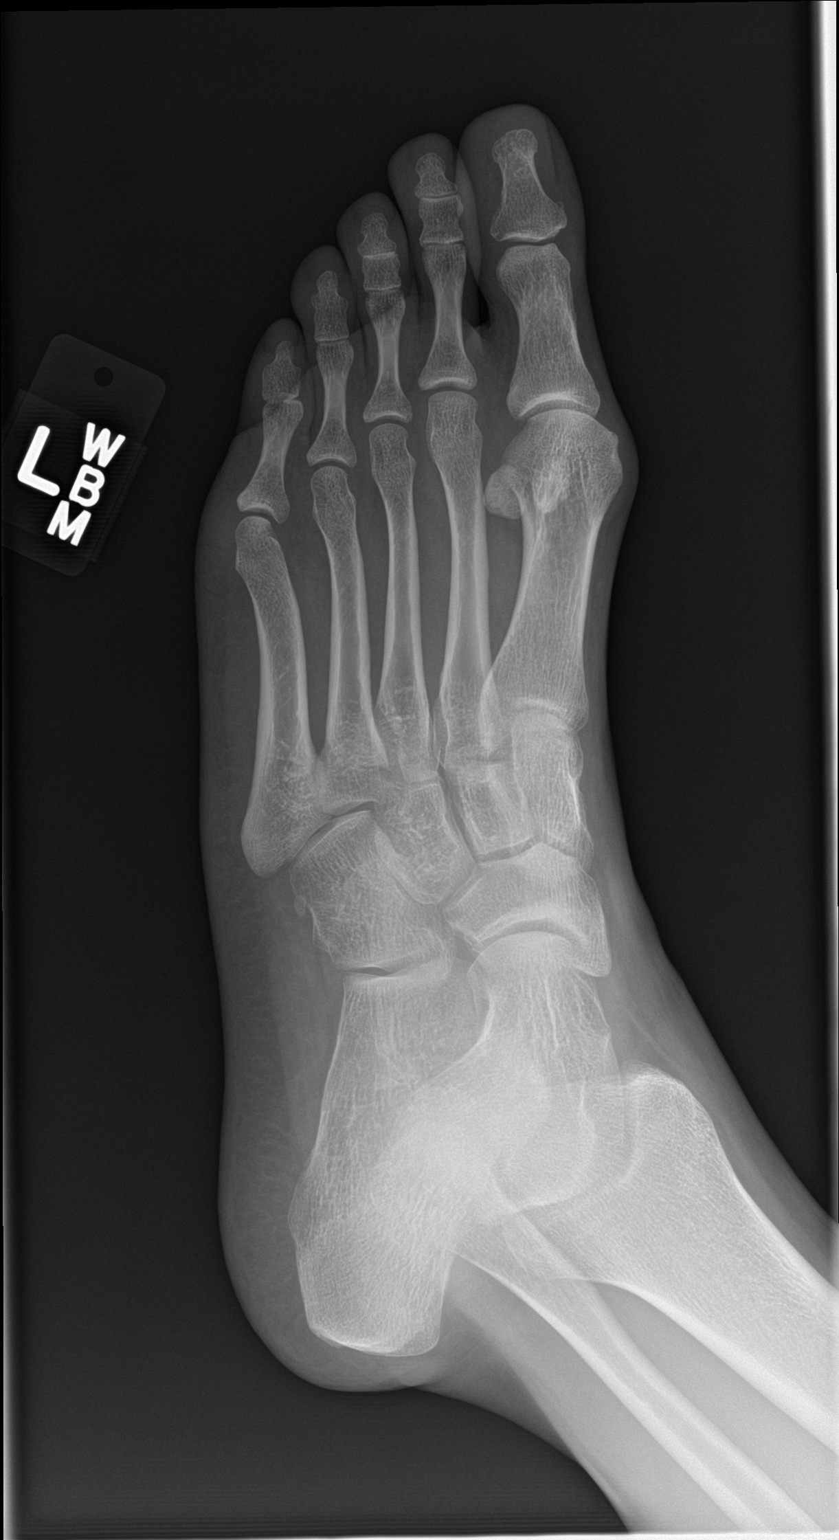

[foot lat]
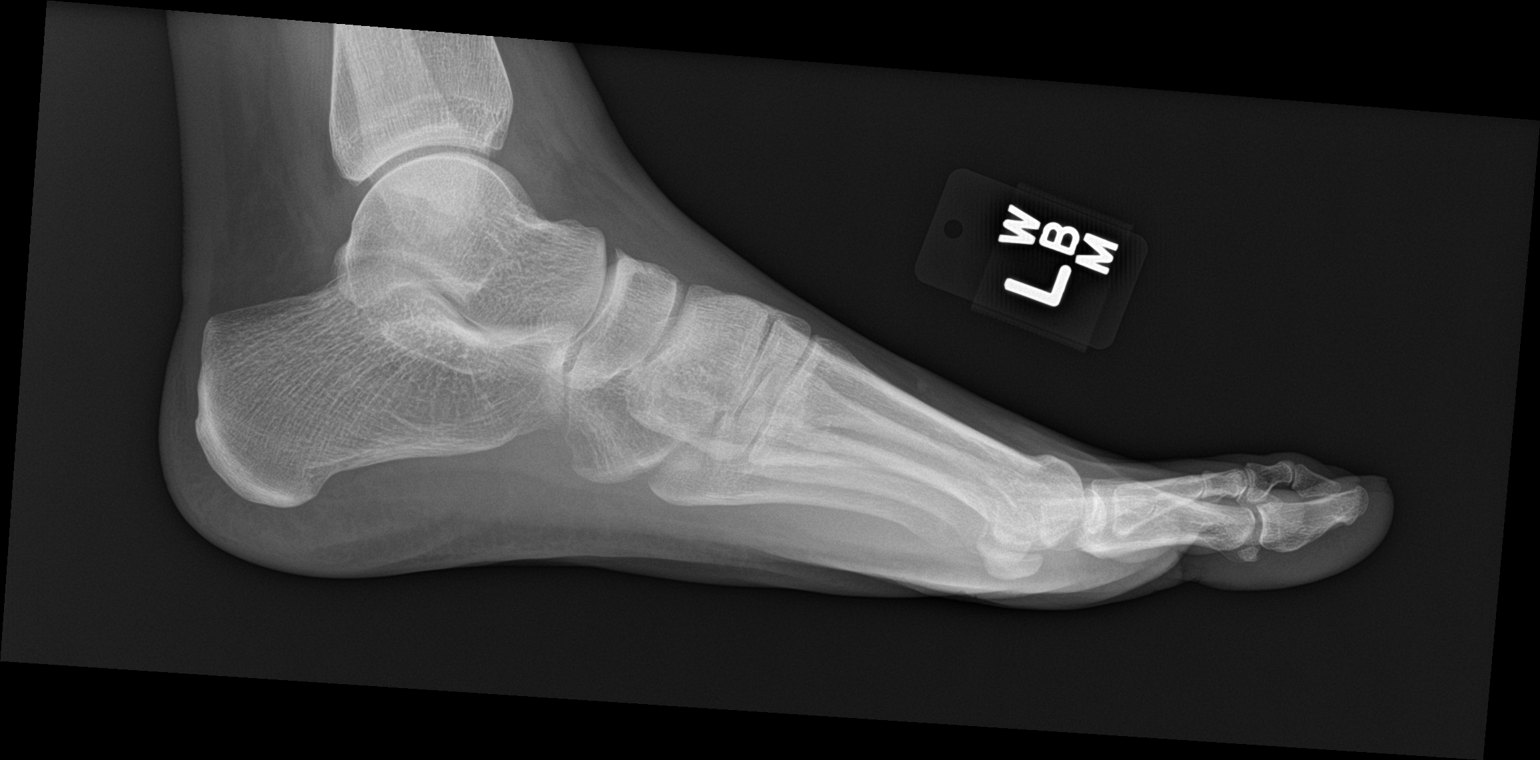

[3 of 3 positions shown; findings below may reference images not displayed]

FINDINGS: No acute fracture or dislocation. The ankle mortise is symmetric.
The talar dome is intact. Small tibiotalar joint effusion. Joint
spaces are preserved. Bone mineralization is normal. Soft tissues
are unremarkable.
IMPRESSION: No acute osseous abnormality of the left foot and ankle.

## 2019-03-31 ENCOUNTER — Ambulatory Visit
Admission: RE | Admit: 2019-03-31 | Discharge: 2019-03-31 | Disposition: A | Discharge status: Home / Self Care | Payer: 59 | Source: Ambulatory Visit | Attending: Family Medicine | Admitting: Family Medicine

## 2019-03-31 DIAGNOSIS — R002 Palpitations: Secondary | ICD-10-CM | POA: Insufficient documentation

## 2019-06-27 ENCOUNTER — Ambulatory Visit: Payer: 59 | Admitting: Family Medicine

## 2019-06-27 NOTE — Progress Notes (Deleted)
   {  Method of visit:23308}  Patient: Courtney Rivers Female    DOB: 21-Aug-1985   33 y.o.   MRN: [id number] Visit Date: 06/27/2019  Today's Provider: [name], MD   No chief complaint on file.  Subjective:     HPI  Depression/Anxiety, Follow-up  She  was last seen for this 3 months ago. Changes made at last visit include continue Effexor, Seroquel, Prazosin and discontinue Buspar.   She reports {excellent/good/fair/poor:19665} compliance with treatment. She {ACTION; IS/IS PJA:25053976} having side effects. ***  She reports {DESC; GOOD/FAIR/POOR:18685} tolerance of treatment. Current symptoms include: {Symptoms; depression:1002} She feels she is {improved/worse/unchanged:3041574} since last visit.  ------------------------------------------------------------------------    Allergies  Allergen Reactions  . Fluoxetine Other (See Comments)    hallucinations, but tolerated Zoloft in the past  . Penicillins Other (See Comments)    Gets yeast infection Has patient had a PCN reaction causing immediate rash, facial/tongue/throat swelling, SOB or lightheadedness with hypotension: No Has patient had a PCN reaction causing severe rash involving mucus membranes or skin necrosis: No Has patient had a PCN reaction that required hospitalization: No Has patient had a PCN reaction occurring within the last 10 years: No If all of the above answers are "NO", then may proceed with Cephalosporin use.      Current Outpatient Medications:  .  busPIRone (BUSPAR) 5 MG tablet, Take 1 tablet (5 mg total) by mouth 2 (two) times daily., Disp: 60 tablet, Rfl: 3 .  cyclobenzaprine (FLEXERIL) 10 MG tablet, Take 1 tablet (10 mg total) by mouth at bedtime., Disp: 30 tablet, Rfl: 0 .  levocetirizine (XYZAL) 5 MG tablet, Take 1 tablet (5 mg total) by mouth every evening., Disp: 30 tablet, Rfl: 2 .  montelukast (SINGULAIR) 10 MG tablet, Take 1 tablet (10 mg total) by mouth at bedtime.,  Disp: 90 tablet, Rfl: 1 .  mupirocin ointment (BACTROBAN) 2 %, PLACE ONE APPLICATION INTO THE NOSE TWO TIMES DAILY, Disp: 22 g, Rfl: 1 .  ondansetron (ZOFRAN-ODT) 4 MG disintegrating tablet, Take 1-2 tablets (4-8 mg total) by mouth every 8 (eight) hours as needed for nausea or vomiting., Disp: 20 tablet, Rfl: 3 .  prazosin (MINIPRESS) 1 MG capsule, Take 1 capsule (1 mg total) by mouth at bedtime., Disp: 90 capsule, Rfl: 3 .  QUEtiapine (SEROQUEL XR) 50 MG TB24 24 hr tablet, Take 1-2 tablets (50-100 mg total) by mouth at bedtime., Disp: 135 tablet, Rfl: 1 .  venlafaxine XR (EFFEXOR XR) 75 MG 24 hr capsule, 2 caps qAM and 1 cap qPM, Disp: 270 capsule, Rfl: 1  Review of Systems  Social History   Tobacco Use  . Smoking status: Never Smoker  . Smokeless tobacco: Never Used  Substance Use Topics  . Alcohol use: Yes    Alcohol/week: 1.0 standard drinks    Types: 1 Standard drinks or equivalent per week    Comment: socially, maybe once monthly      Objective:   LMP 02/25/2017  There were no vitals filed for this visit.There is no height or weight on file to calculate BMI.   Physical Exam   No results found for any visits on 06/27/19.     Assessment & Plan        [name], MD  [facility]

## 2019-06-28 ENCOUNTER — Ambulatory Visit (INDEPENDENT_AMBULATORY_CARE_PROVIDER_SITE_OTHER): Payer: 59 | Admitting: Family Medicine

## 2019-06-28 ENCOUNTER — Other Ambulatory Visit: Payer: Self-pay

## 2019-06-28 VITALS — BP 103/69

## 2019-06-28 DIAGNOSIS — F411 Generalized anxiety disorder: Secondary | ICD-10-CM

## 2019-06-28 DIAGNOSIS — F3342 Major depressive disorder, recurrent, in full remission: Secondary | ICD-10-CM | POA: Diagnosis not present

## 2019-06-28 NOTE — Progress Notes (Signed)
Patient: Courtney Rivers Female    DOB: May 26, 1986   33 y.o.   MRN: [id number] Visit Date: 06/28/2019  Today's Provider: Shirlee Latch, MD   Chief Complaint  Patient presents with  . Depression  . Anxiety   Subjective:     HPI  DepressionAnxiety, Follow-up  She  was last seen for this 3 months ago. Changes made at last visit include continue Effexor, Seroquel and prazosin at current doses. Patient advised to discontinue Buspar.   She reports poor compliance with treatment.  She has discontinued Seroquel, prazsin and Effexor as she was feeling good She is not having side effects.   She reports good to fair tolerance of treatment. Current symptoms include: none She feels she is Unchanged since last visit.  ------------------------------------------------------------------------ GAD 7 : Generalized Anxiety Score 06/28/2019 03/04/2019 11/03/2018 02/02/2018  Nervous, Anxious, on Edge 0 2 3 1   Control/stop worrying 0 0 3 0  Worry too much - different things 0 0 3 0  Trouble relaxing 0 0 3 0  Restless 0 0 3 0  Easily annoyed or irritable 0 3 3 1   Afraid - awful might happen 0 0 0 0  Total GAD 7 Score 0 5 18 2   Anxiety Difficulty Not difficult at all Somewhat difficult - -     Depression screen [facility] 2/9 06/28/2019 03/04/2019 11/03/2018 11/03/2018 02/02/2018  Decreased Interest 0 0 1 1 0  Down, Depressed, Hopeless 0 0 1 2 1   PHQ - 2 Score 0 0 2 3 1   Altered sleeping 0 0 3 2 0  Tired, decreased energy 0 0 3 2 0  Change in appetite 1 3 2 3 1   Feeling bad or failure about yourself  0 1 1 1  0  Trouble concentrating 0 2 1 1  0  Moving slowly or fidgety/restless 0 0 0 2 0  Suicidal thoughts 0 0 0 0 0  PHQ-9 Score 1 6 12 14 2   Difficult doing work/chores Not difficult at all Somewhat difficult Somewhat difficult Somewhat difficult -  Some recent data might be hidden      Allergies  Allergen Reactions  . Fluoxetine Other (See Comments)    hallucinations, but  tolerated Zoloft in the past  . Penicillins Other (See Comments)    Gets yeast infection Has patient had a PCN reaction causing immediate rash, facial/tongue/throat swelling, SOB or lightheadedness with hypotension: No Has patient had a PCN reaction causing severe rash involving mucus membranes or skin necrosis: No Has patient had a PCN reaction that required hospitalization: No Has patient had a PCN reaction occurring within the last 10 years: No If all of the above answers are "NO", then may proceed with Cephalosporin use.      Current Outpatient Medications:  .  montelukast (SINGULAIR) 10 MG tablet, Take 1 tablet (10 mg total) by mouth at bedtime., Disp: 90 tablet, Rfl: 1 .  mupirocin ointment (BACTROBAN) 2 %, PLACE ONE APPLICATION INTO THE NOSE TWO TIMES DAILY, Disp: 22 g, Rfl: 1 .  ondansetron (ZOFRAN-ODT) 4 MG disintegrating tablet, Take 1-2 tablets (4-8 mg total) by mouth every 8 (eight) hours as needed for nausea or vomiting., Disp: 20 tablet, Rfl: 3 .  cyclobenzaprine (FLEXERIL) 10 MG tablet, Take 1 tablet (10 mg total) by mouth at bedtime. (Patient not taking: Reported on 06/28/2019), Disp: 30 tablet, Rfl: 0 .  levocetirizine (XYZAL) 5 MG tablet, Take 1 tablet (5 mg total) by mouth every evening. (Patient not  taking: Reported on 06/28/2019), Disp: 30 tablet, Rfl: 2  Review of Systems  Constitutional: Negative.   Respiratory: Negative.   Cardiovascular: Negative.   Psychiatric/Behavioral: Positive for agitation.    Social History   Tobacco Use  . Smoking status: Never Smoker  . Smokeless tobacco: Never Used  Substance Use Topics  . Alcohol use: Yes    Alcohol/week: 1.0 standard drinks    Types: 1 Standard drinks or equivalent per week    Comment: socially, maybe once monthly      Objective:   BP 103/69   LMP 02/25/2017  Vitals:   06/28/19 1028  BP: 103/69  There is no height or weight on file to calculate BMI.   Physical Exam Vitals signs reviewed.   Constitutional:      General: She is not in acute distress.    Appearance: Normal appearance. She is well-developed. She is not diaphoretic.  HENT:     Head: Normocephalic and atraumatic.  Eyes:     General: No scleral icterus.    Conjunctiva/sclera: Conjunctivae normal.  Neck:     Musculoskeletal: Neck supple.     Thyroid: No thyromegaly.  Cardiovascular:     Rate and Rhythm: Normal rate and regular rhythm.     Pulses: Normal pulses.     Heart sounds: Normal heart sounds. No murmur.  Pulmonary:     Effort: Pulmonary effort is normal. No respiratory distress.     Breath sounds: Normal breath sounds. No wheezing, rhonchi or rales.  Musculoskeletal:     Right lower leg: No edema.     Left lower leg: No edema.  Lymphadenopathy:     Cervical: No cervical adenopathy.  Skin:    General: Skin is warm and dry.     Capillary Refill: Capillary refill takes less than 2 seconds.     Findings: No rash.  Neurological:     Mental Status: She is alert and oriented to person, place, and time. Mental status is at baseline.  Psychiatric:        Mood and Affect: Mood normal.        Behavior: Behavior normal.      No results found for any visits on 06/28/19.     Assessment & Plan   Problem List Items Addressed This Visit      Other   MDD (major depressive disorder) - Primary    Chronic, now in remission Has been able to d/c all medications Contracted for safety EAP prn for counseling       GAD (generalized anxiety disorder)    Chronic and well controlled Has been able to d/c all medications Advised on EAP if needed Return precautions discussed          Return in about 6 months (around 12/26/2019) for CPE.   The entirety of the information documented in the History of Present Illness, Review of Systems and Physical Exam were personally obtained by me. Portions of this information were initially documented by Lynford Humphrey , CMA and reviewed by me for thoroughness and  accuracy.    Keysi Oelkers, Dionne Bucy, MD MPH [facility]

## 2019-06-28 NOTE — Assessment & Plan Note (Signed)
Chronic and well controlled Has been able to d/c all medications Advised on EAP if needed Return precautions discussed

## 2019-06-28 NOTE — Assessment & Plan Note (Signed)
Chronic, now in remission Has been able to d/c all medications Contracted for safety EAP prn for counseling

## 2019-07-04 ENCOUNTER — Ambulatory Visit: Payer: Self-pay | Admitting: Family Medicine

## 2019-07-06 ENCOUNTER — Other Ambulatory Visit: Payer: Self-pay | Admitting: Family Medicine

## 2019-07-06 MED ORDER — MUPIROCIN 2 % EX OINT: TOPICAL_OINTMENT | CUTANEOUS | 1 refills | Status: DC

## 2019-07-06 NOTE — Telephone Encounter (Signed)
Please see encounter below

## 2019-07-06 NOTE — Telephone Encounter (Signed)
Medication refill:  QUEtiapine (SEROQUEL XR) 50 MG TB24 24 hr tablet [[id number]]  prazosin (MINIPRESS) 1 MG capsule mupirocin ointment (BACTROBAN) 2 %   Pharmacy:  [facility] [address], [place] [phone] (Phone) [phone] (Fax)   Pt aware of turn around time

## 2019-08-23 NOTE — Progress Notes (Signed)
Patient: Courtney Rivers Female    DOB: 1986-02-25   34 y.o.   MRN: [id number] Visit Date: 08/24/2019  Today's Provider: Shirlee Latch, MD   Chief Complaint  Patient presents with  . Depression   Subjective:    Virtual Visit via Video Note  I connected with Courtney Rivers on 08/24/19 at  4:00 PM EST by a video enabled telemedicine application and verified that I am speaking with the correct person using two identifiers.  Location:  Patient location: home Provider location: [facility] Persons involved in the visit: patient, provider   I discussed the limitations of evaluation and management by telemedicine and the availability of in person appointments. The patient expressed understanding and agreed to proceed.  HPI  Depression/Anxiety, Follow-up  She  was last seen for this 2 months ago. Changes made at last visit include no changes. Patient has been able to d/c all medications.  Current symptoms include: agitation  No depression symptoms, but feeling more anxious and overwhelmed.  Difficulty falling asleep.  Feels like she gets overtired during the day and then gets a second wind before she is able to fall asleep at night.  Does not really feel refreshed in the morning when she gets up and has to start working again.  She is currently working 2 jobs, taking care of her mother who recently had surgery, looking for a new place to live as her lease is up, and many other stressors. She feels she is slightly Worse since last visit.  GAD 7 : Generalized Anxiety Score 08/24/2019 06/28/2019 03/04/2019 11/03/2018  Nervous, Anxious, on Edge 1 0 2 3  Control/stop worrying 1 0 0 3  Worry too much - different things 1 0 0 3  Trouble relaxing 1 0 0 3  Restless 0 0 0 3  Easily annoyed or irritable 2 0 3 3  Afraid - awful might happen 0 0 0 0  Total GAD 7 Score 6 0 5 18  Anxiety Difficulty Somewhat difficult Not difficult at all Somewhat difficult -     Depression screen [facility] 2/9 08/24/2019 06/28/2019 03/04/2019 11/03/2018 11/03/2018  Decreased Interest 0 0 0 1 1  Down, Depressed, Hopeless 0 0 0 1 2  PHQ - 2 Score 0 0 0 2 3  Altered sleeping 1 0 0 3 2  Tired, decreased energy 0 0 0 3 2  Change in appetite 1 1 3 2 3   Feeling bad or failure about yourself  0 0 1 1 1   Trouble concentrating 0 0 2 1 1   Moving slowly or fidgety/restless 0 0 0 0 2  Suicidal thoughts 0 0 0 0 0  PHQ-9 Score 2 1 6 12 14   Difficult doing work/chores Not difficult at all Not difficult at all Somewhat difficult Somewhat difficult Somewhat difficult  Some recent data might be hidden    ------------------------------------------------------------------------ She also reports that she continues to get dry and bleeding spots inside both sides of her nose.  She stopped using Vaseline because she was told that she could aspirate this and it was not good for her lungs, so she has been using air nasal saline gel that is OTC.  She feels like it helps, but does not last long enough.  She does not have a humidifier.  Her son has similar symptoms.  She denies any fever, sore throat, cough, or other infectious symptoms.   Allergies  Allergen Reactions  . Fluoxetine Other (See Comments)  hallucinations, but tolerated Zoloft in the past  . Penicillins Other (See Comments)    Gets yeast infection Has patient had a PCN reaction causing immediate rash, facial/tongue/throat swelling, SOB or lightheadedness with hypotension: No Has patient had a PCN reaction causing severe rash involving mucus membranes or skin necrosis: No Has patient had a PCN reaction that required hospitalization: No Has patient had a PCN reaction occurring within the last 10 years: No If all of the above answers are "NO", then may proceed with Cephalosporin use.      Current Outpatient Medications:  .  levocetirizine (XYZAL) 5 MG tablet, Take 1 tablet (5 mg total) by mouth every evening., Disp: 30  tablet, Rfl: 2 .  mupirocin ointment (BACTROBAN) 2 %, PLACE ONE APPLICATION INTO THE NOSE TWO TIMES DAILY, Disp: 22 g, Rfl: 1 .  ondansetron (ZOFRAN-ODT) 4 MG disintegrating tablet, Take 1-2 tablets (4-8 mg total) by mouth every 8 (eight) hours as needed for nausea or vomiting., Disp: 20 tablet, Rfl: 3 .  montelukast (SINGULAIR) 10 MG tablet, Take 1 tablet (10 mg total) by mouth at bedtime. (Patient not taking: Reported on 08/23/2019), Disp: 90 tablet, Rfl: 1  Review of Systems  Constitutional: Positive for activity change and fatigue.  HENT: Negative.   Respiratory: Negative.   Cardiovascular: Negative.   Psychiatric/Behavioral: Positive for agitation and sleep disturbance.    Social History   Tobacco Use  . Smoking status: Never Smoker  . Smokeless tobacco: Never Used  Substance Use Topics  . Alcohol use: Yes    Alcohol/week: 1.0 standard drinks    Types: 1 Standard drinks or equivalent per week    Comment: socially, maybe once monthly      Objective:   LMP 02/25/2017  There were no vitals filed for this visit.There is no height or weight on file to calculate BMI.   Physical Exam Constitutional:      Appearance: Normal appearance.  HENT:     Head: Normocephalic and atraumatic.  Eyes:     General: No scleral icterus.    Conjunctiva/sclera: Conjunctivae normal.  Pulmonary:     Effort: Pulmonary effort is normal. No respiratory distress.  Neurological:     Mental Status: She is alert and oriented to person, place, and time. Mental status is at baseline.  Psychiatric:        Mood and Affect: Mood normal.        Behavior: Behavior normal.      No results found for any visits on 08/24/19.     Assessment & Plan    I discussed the assessment and treatment plan with the patient. The patient was provided an opportunity to ask questions and all were answered. The patient agreed with the plan and demonstrated an understanding of the instructions.   The patient was advised  to call back or seek an in-person evaluation if the symptoms worsen or if the condition fails to improve as anticipated.  Problem List Items Addressed This Visit      Other   MDD (major depressive disorder)    Chronic, now in remission Was able to get off of all of her medications at her last visit Contracted for safety EAP for counseling      GAD (generalized anxiety disorder) - Primary    Chronic with recent exacerbation Was previously well controlled and she had been able to discontinue all of her medications She is working with the EAP on what sounds like CBT paperwork Given her difficulty with  sleeping and shutting off at the end of the day, we will resume Seroquel 50 mg nightly Discussed that a lot of her feeling overwhelmed and anxious may be related to burnout and all of the stressful situations in her life We discussed the importance of therapy and carving out time for herself to relax and reset Doubt that going back on her Effexor would help, as this is all situational at this time Follow-up as needed or in 4 months at CPE      Nasal dryness    Continue nasal saline gel Encouraged her to add a humidifier to her bedside          Return in about 4 months (around 12/22/2019) for CPE.   The entirety of the information documented in the History of Present Illness, Review of Systems and Physical Exam were personally obtained by me. Portions of this information were initially documented by Lynford Humphrey and/or April Ival Bible, CMA and reviewed by me for thoroughness and accuracy.    Blanche Gallien, Dionne Bucy, MD MPH [facility]

## 2019-08-24 ENCOUNTER — Telehealth (INDEPENDENT_AMBULATORY_CARE_PROVIDER_SITE_OTHER): Payer: 59 | Admitting: Family Medicine

## 2019-08-24 DIAGNOSIS — J3489 Other specified disorders of nose and nasal sinuses: Secondary | ICD-10-CM

## 2019-08-24 DIAGNOSIS — F3342 Major depressive disorder, recurrent, in full remission: Secondary | ICD-10-CM

## 2019-08-24 DIAGNOSIS — F411 Generalized anxiety disorder: Secondary | ICD-10-CM

## 2019-08-24 MED ORDER — QUETIAPINE FUMARATE 50 MG PO TABS: 50.0000 mg | ORAL_TABLET | Freq: Every day | ORAL | 2 refills | Status: DC

## 2019-08-24 NOTE — Assessment & Plan Note (Signed)
Chronic, now in remission Was able to get off of all of her medications at her last visit Contracted for safety EAP for counseling

## 2019-08-24 NOTE — Assessment & Plan Note (Signed)
Continue nasal saline gel Encouraged her to add a humidifier to her bedside

## 2019-08-24 NOTE — Assessment & Plan Note (Signed)
Chronic with recent exacerbation Was previously well controlled and she had been able to discontinue all of her medications She is working with the EAP on what sounds like CBT paperwork Given her difficulty with sleeping and shutting off at the end of the day, we will resume Seroquel 50 mg nightly Discussed that a lot of her feeling overwhelmed and anxious may be related to burnout and all of the stressful situations in her life We discussed the importance of therapy and carving out time for herself to relax and reset Doubt that going back on her Effexor would help, as this is all situational at this time Follow-up as needed or in 4 months at CPE

## 2019-10-14 ENCOUNTER — Ambulatory Visit (INDEPENDENT_AMBULATORY_CARE_PROVIDER_SITE_OTHER): Payer: 59 | Admitting: Family Medicine

## 2019-10-14 ENCOUNTER — Encounter: Payer: Self-pay | Admitting: Family Medicine

## 2019-10-14 ENCOUNTER — Other Ambulatory Visit: Payer: Self-pay

## 2019-10-14 VITALS — BP 111/75 | HR 66 | Ht 67.72 in | Wt 121.5 lb

## 2019-10-14 DIAGNOSIS — M9904 Segmental and somatic dysfunction of sacral region: Secondary | ICD-10-CM

## 2019-10-14 DIAGNOSIS — M9903 Segmental and somatic dysfunction of lumbar region: Secondary | ICD-10-CM | POA: Diagnosis not present

## 2019-10-14 DIAGNOSIS — M545 Low back pain, unspecified: Secondary | ICD-10-CM

## 2019-10-14 DIAGNOSIS — M9908 Segmental and somatic dysfunction of rib cage: Secondary | ICD-10-CM

## 2019-10-14 DIAGNOSIS — M9909 Segmental and somatic dysfunction of abdomen and other regions: Secondary | ICD-10-CM | POA: Diagnosis not present

## 2019-10-14 DIAGNOSIS — M9905 Segmental and somatic dysfunction of pelvic region: Secondary | ICD-10-CM

## 2019-10-15 NOTE — Progress Notes (Signed)
BP 111/75 (BP Location: Left Arm, Patient Position: Sitting, Cuff Size: Normal)   Pulse 66   Ht 5' 7.72" (1.72 m)   Wt 121 lb 8 oz (55.1 kg)   LMP 02/25/2017   SpO2 100%   BMI 18.63 kg/m    Subjective:    Patient ID: Courtney Rivers, female    DOB: 1986-07-01, 34 y.o.   MRN: [id number]  HPI: Courtney Rivers is a 34 y.o. female  Chief Complaint  Patient presents with  . Back Pain   Courtney Rivers presents today for evaluation and possible treatment of low back pain. She notes that her pain started about 2 weeks ago. She has been trying to stretch herself out, but hasn't been able to. She states that it feels like it 'needs to pop'. Pain is aching and deep in nature. Nothing makes it better and sitting a long time make it worse. No radiation. She is otherwise feeling well with no other concerns or complaints at this time.   Relevant past medical, surgical, family and social history reviewed and updated as indicated. Interim medical history since our last visit reviewed. Allergies and medications reviewed and updated.  Review of Systems  Constitutional: Negative.   Respiratory: Negative.   Cardiovascular: Negative.   Gastrointestinal: Negative.   Musculoskeletal: Positive for back pain and myalgias. Negative for arthralgias, gait problem, joint swelling, neck pain and neck stiffness.  Skin: Negative.   Neurological: Negative.   Psychiatric/Behavioral: Negative.     Per HPI unless specifically indicated above     Objective:    BP 111/75 (BP Location: Left Arm, Patient Position: Sitting, Cuff Size: Normal)   Pulse 66   Ht 5' 7.72" (1.72 m)   Wt 121 lb 8 oz (55.1 kg)   LMP 02/25/2017   SpO2 100%   BMI 18.63 kg/m   Wt Readings from Last 3 Encounters:  10/14/19 121 lb 8 oz (55.1 kg)  03/04/19 124 lb 12.8 oz (56.6 kg)  02/10/19 127 lb (57.6 kg)    Physical Exam Vitals and nursing note reviewed.  Constitutional:      General: She is not in acute distress.  Appearance: Normal appearance. She is not ill-appearing.  HENT:     Head: Normocephalic and atraumatic.     Right Ear: External ear normal.     Left Ear: External ear normal.     Nose: Nose normal.     Mouth/Throat:     Mouth: Mucous membranes are moist.     Pharynx: Oropharynx is clear.  Eyes:     Extraocular Movements: Extraocular movements intact.     Conjunctiva/sclera: Conjunctivae normal.     Pupils: Pupils are equal, round, and reactive to light.  Neck:     Vascular: No carotid bruit.  Cardiovascular:     Rate and Rhythm: Normal rate.     Pulses: Normal pulses.  Pulmonary:     Effort: Pulmonary effort is normal. No respiratory distress.  Abdominal:     General: Abdomen is flat. There is no distension.     Palpations: Abdomen is soft. There is no mass.     Tenderness: There is no abdominal tenderness. There is no right CVA tenderness, left CVA tenderness, guarding or rebound.     Hernia: No hernia is present.  Musculoskeletal:     Cervical back: No muscular tenderness.  Lymphadenopathy:     Cervical: No cervical adenopathy.  Skin:    General: Skin is warm and dry.  Capillary Refill: Capillary refill takes less than 2 seconds.     Coloration: Skin is not jaundiced or pale.     Findings: No bruising, erythema, lesion or rash.  Neurological:     General: No focal deficit present.     Mental Status: She is alert. Mental status is at baseline.  Psychiatric:        Mood and Affect: Mood normal.        Behavior: Behavior normal.        Thought Content: Thought content normal.        Judgment: Judgment normal.   Musculoskeletal:  Exam found Decreased ROM, Tissue texture changes, Tenderness to palpation and Asymmetry of patient's  ribs, lumbar, pelvis, sacrum and abdomen Osteopathic Structural Exam:   Ribs: Ribs 5-6 locked up on the R  Lumbar: QL hypertonic on the L, L3-5SLRR, L1-2SRRL  Pelvis: Posterior L innominate  Sacrum: L on L torsion  Abdomen: diaphragm  hypertonic on the L   Results for orders placed or performed in visit on 02/10/19  Urine Culture   Specimen: Urine, Clean Catch   URINE  Result Value Ref Range   Urine Culture, Routine Final report    Organism ID, Bacteria No growth       Assessment & Plan:   Problem List Items Addressed This Visit    None    Visit Diagnoses    Acute left-sided low back pain without sciatica    -  Primary   In acute exacerbation. She does have some somatic dysfunction that is contributing to her symptoms. Treated today with good results as below. Call with concerns   Lumbar region somatic dysfunction       Sacral region somatic dysfunction       Rib cage region somatic dysfunction       Pelvic somatic dysfunction       Segmental dysfunction of abdomen         After verbal consent was obtained, patient was treated today with osteopathic manipulative medicine to the regions of the ribs, lumbar, pelvis, sacrum and abdomen using the techniques of myofascial release, counterstrain, muscle energy and HVLA. Areas of compensation relating to her primary pain source also treated. Patient tolerated the procedure well with good objective and good subjective improvement in symptoms. She left the room in good condition. She was advised to stay well hydrated and that she may have some soreness following the procedure. If not improving or worsening, she will call and come in. She will return for reevaluation  on a PRN basis.   Follow up plan: Return if symptoms worsen or fail to improve.

## 2019-10-16 ENCOUNTER — Encounter: Payer: Self-pay | Admitting: Family Medicine

## 2019-11-08 ENCOUNTER — Ambulatory Visit (INDEPENDENT_AMBULATORY_CARE_PROVIDER_SITE_OTHER): Payer: 59 | Admitting: Family Medicine

## 2019-11-08 ENCOUNTER — Encounter: Payer: Self-pay | Admitting: Family Medicine

## 2019-11-08 VITALS — Temp 98.3°F | Wt 120.4 lb

## 2019-11-08 DIAGNOSIS — F411 Generalized anxiety disorder: Secondary | ICD-10-CM

## 2019-11-08 MED ORDER — VENLAFAXINE HCL ER 75 MG PO CP24: 75.0000 mg | ORAL_CAPSULE | Freq: Every day | ORAL | 3 refills | Status: DC

## 2019-11-08 NOTE — Progress Notes (Signed)
Patient: Courtney Rivers Female    DOB: Sep 29, 1985   34 y.o.   MRN: [id number] Visit Date: 11/08/2019  Today's Provider: Shirlee Latch, MD   Chief Complaint  Patient presents with  . Anxiety   Subjective:    Virtual Visit via Telephone Note I connected with Courtney Rivers on 11/08/19 at 11:20 AM EDT by telephone and verified that I am speaking with the correct person using two identifiers.  Location: Patient location: home Provider location: [facility] Persons involved in the visit: patient, provider   I discussed the limitations, risks, security and privacy concerns of performing an evaluation and management service by telephone and the availability of in person appointments. I also discussed with the patient that there may be a patient responsible charge related to this service. The patient expressed understanding and agreed to proceed.  HPI  Anxiety, Follow-up  She  was last seen for this 4 months ago. Changes made at last visit include no changes.   She reports fair compliance with treatment. Patient reports she take medication every Monday and Tuesday night.  She is not having side effects.   She reports good to fair tolerance of treatment. Current symptoms include: anxiety, agitation.  Worse over the last 2 weeks.  No recent stressors or changes. She feels she is Worse since last visit.  Feels really irritable.  No change in sleep.  GAD 7 : Generalized Anxiety Score 11/08/2019 08/24/2019 06/28/2019 03/04/2019  Nervous, Anxious, on Edge 3 1 0 2  Control/stop worrying 0 1 0 0  Worry too much - different things 2 1 0 0  Trouble relaxing 2 1 0 0  Restless 0 0 0 0  Easily annoyed or irritable 3 2 0 3  Afraid - awful might happen 0 0 0 0  Total GAD 7 Score 10 6 0 5  Anxiety Difficulty Very difficult Somewhat difficult Not difficult at all Somewhat difficult   Depression screen [facility] 2/9 11/08/2019 08/24/2019 06/28/2019 03/04/2019 11/03/2018    Decreased Interest 0 0 0 0 1  Down, Depressed, Hopeless 0 0 0 0 1  PHQ - 2 Score 0 0 0 0 2  Altered sleeping 0 1 0 0 3  Tired, decreased energy 0 0 0 0 3  Change in appetite 2 1 1 3 2   Feeling bad or failure about yourself  0 0 0 1 1  Trouble concentrating 1 0 0 2 1  Moving slowly or fidgety/restless 0 0 0 0 0  Suicidal thoughts 0 0 0 0 0  PHQ-9 Score 3 2 1 6 12   Difficult doing work/chores Very difficult Not difficult at all Not difficult at all Somewhat difficult Somewhat difficult  Some recent data might be hidden    ------------------------------------------------------------------------    Allergies  Allergen Reactions  . Fluoxetine Other (See Comments)    hallucinations, but tolerated Zoloft in the past  . Penicillins Other (See Comments)    Gets yeast infection Has patient had a PCN reaction causing immediate rash, facial/tongue/throat swelling, SOB or lightheadedness with hypotension: No Has patient had a PCN reaction causing severe rash involving mucus membranes or skin necrosis: No Has patient had a PCN reaction that required hospitalization: No Has patient had a PCN reaction occurring within the last 10 years: No If all of the above answers are "NO", then may proceed with Cephalosporin use.      Current Outpatient Medications:  .  levocetirizine (XYZAL) 5 MG tablet, Take 1  tablet (5 mg total) by mouth every evening., Disp: 30 tablet, Rfl: 2 .  montelukast (SINGULAIR) 10 MG tablet, Take 1 tablet (10 mg total) by mouth at bedtime., Disp: 90 tablet, Rfl: 1 .  mupirocin ointment (BACTROBAN) 2 %, PLACE ONE APPLICATION INTO THE NOSE TWO TIMES DAILY, Disp: 22 g, Rfl: 1 .  ondansetron (ZOFRAN-ODT) 4 MG disintegrating tablet, Take 1-2 tablets (4-8 mg total) by mouth every 8 (eight) hours as needed for nausea or vomiting., Disp: 20 tablet, Rfl: 3 .  QUEtiapine (SEROQUEL) 50 MG tablet, Take 1 tablet (50 mg total) by mouth at bedtime., Disp: 30 tablet, Rfl: 2  Review of  Systems  Constitutional: Positive for fatigue.  HENT: Negative.   Respiratory: Negative.   Cardiovascular: Negative.   Psychiatric/Behavioral: Positive for agitation. The patient is nervous/anxious.     Social History   Tobacco Use  . Smoking status: Never Smoker  . Smokeless tobacco: Never Used  Substance Use Topics  . Alcohol use: Yes    Alcohol/week: 1.0 standard drinks    Types: 1 Standard drinks or equivalent per week    Comment: socially, maybe once monthly      Objective:   Temp 98.3 F (36.8 C) (Oral)   Wt 120 lb 6.4 oz (54.6 kg)   LMP 02/25/2017   BMI 18.46 kg/m  Vitals:   11/08/19 1126  Temp: 98.3 F (36.8 C)  TempSrc: Oral  Weight: 120 lb 6.4 oz (54.6 kg)  Body mass index is 18.46 kg/m.   Physical Exam Speaking in full sentences in no apparent distress  No results found for any visits on 11/08/19.     Assessment & Plan    I discussed the assessment and treatment plan with the patient. The patient was provided an opportunity to ask questions and all were answered. The patient agreed with the plan and demonstrated an understanding of the instructions.   The patient was advised to call back or seek an in-person evaluation if the symptoms worsen or if the condition fails to improve as anticipated.  Problem List Items Addressed This Visit      Other   GAD (generalized anxiety disorder) - Primary    Chronic with recent exacerbation As previously well controlled and she has been able to discontinue all of her medications She is working with the EAP Continue Seroquel for sleep Resume Effexor at smaller dose of ER 75 mg daily At follow-up, repeat PHQ-9 and GAD-7      Relevant Medications   venlafaxine XR (EFFEXOR XR) 75 MG 24 hr capsule       Return in about 6 weeks (around 12/20/2019) for MDD/GAD f/u, virtual.   The entirety of the information documented in the History of Present Illness, Review of Systems and Physical Exam were personally  obtained by me. Portions of this information were initially documented by Lynford Humphrey, CMA and reviewed by me for thoroughness and accuracy.    Dinesh Ulysse, Dionne Bucy, MD MPH [facility]

## 2019-11-09 NOTE — Assessment & Plan Note (Signed)
Chronic with recent exacerbation As previously well controlled and she has been able to discontinue all of her medications She is working with the EAP Continue Seroquel for sleep Resume Effexor at smaller dose of ER 75 mg daily At follow-up, repeat PHQ-9 and GAD-7

## 2019-12-07 ENCOUNTER — Ambulatory Visit: Payer: 59 | Admitting: Family Medicine

## 2019-12-21 ENCOUNTER — Encounter: Payer: Self-pay | Admitting: Family Medicine

## 2019-12-21 ENCOUNTER — Telehealth (INDEPENDENT_AMBULATORY_CARE_PROVIDER_SITE_OTHER): Payer: 59 | Admitting: Family Medicine

## 2019-12-21 VITALS — Wt 121.0 lb

## 2019-12-21 DIAGNOSIS — F3342 Major depressive disorder, recurrent, in full remission: Secondary | ICD-10-CM

## 2019-12-21 DIAGNOSIS — J3489 Other specified disorders of nose and nasal sinuses: Secondary | ICD-10-CM | POA: Diagnosis not present

## 2019-12-21 DIAGNOSIS — F411 Generalized anxiety disorder: Secondary | ICD-10-CM | POA: Diagnosis not present

## 2019-12-21 DIAGNOSIS — J301 Allergic rhinitis due to pollen: Secondary | ICD-10-CM

## 2019-12-21 MED ORDER — MUPIROCIN 2 % EX OINT: TOPICAL_OINTMENT | CUTANEOUS | 1 refills | Status: DC

## 2019-12-21 MED ORDER — MONTELUKAST SODIUM 10 MG PO TABS: 10.0000 mg | ORAL_TABLET | Freq: Every day | ORAL | 3 refills | Status: DC

## 2019-12-21 NOTE — Assessment & Plan Note (Signed)
Chronic and in remission On Effexor for anxiety EAP for counseling

## 2019-12-21 NOTE — Assessment & Plan Note (Signed)
Chronic and well controlled Working with EAP Continue Seroquel prn for sleep Continue low dose Effexor

## 2019-12-21 NOTE — Progress Notes (Signed)
Courtney Rivers,acting as a Education administrator for Courtney Rivers.,have documented all relevant documentation on the behalf of Courtney Rivers,as directed by  Courtney Rivers while in the presence of Courtney Rivers.  MyChart Video Visit    Virtual Visit via Video Note   This visit type was conducted due to national recommendations for restrictions regarding the COVID-19 Pandemic (e.g. social distancing) in an effort to limit this patient's exposure and mitigate transmission in our community. This patient is at least at moderate risk for complications without adequate follow up. This format is felt to be most appropriate for this patient at this time. Physical exam was limited by quality of the video and audio technology used for the visit.    Patient location: home Provider location: Courtney Rivers Persons involved in the visit: patient, provider   Patient: Courtney Rivers   DOB: [birth year]   34 y.o. Female  MRN: [id number] Visit Date: 12/21/2019  Today's healthcare provider: [name], Rivers   Chief Complaint  Patient presents with  . Anxiety  . Depression   Subjective    HPI Anxiety, Follow-up  She was last seen for anxiety 6 weeks ago. Changes made at last visit include restarting Effexor at 75 mg daily.   She reports excellent compliance with treatment. She reports excellent tolerance of treatment. She is not having side effects.   She feels her anxiety is mild and Improved since last visit.  Symptoms: No chest pain No difficulty concentrating  No dizziness No fatigue  No feelings of losing control No insomnia  No irritable No palpitations  No panic attacks No racing thoughts  No shortness of breath No sweating  No tremors/shakes    GAD-7 Results GAD-7 Generalized Anxiety Disorder Screening Tool 12/21/2019 11/08/2019 08/24/2019  1. Feeling Nervous, Anxious, or on Edge 0 3 1  2. Not Being Able to Stop or Control Worrying 0  0 1  3. Worrying Too Much About Different Things 0 2 1  4. Trouble Relaxing 0 2 1  5. Being So Restless it's Hard To Sit Still 0 0 0  6. Becoming Easily Annoyed or Irritable 0 3 2  7. Feeling Afraid As If Something Awful Might Happen 0 0 0  Total GAD-7 Score 0 10 6  Difficulty At Work, Home, or Getting  Along With Others? Not difficult at all Very difficult Somewhat difficult    PHQ-9 Scores PHQ9 SCORE ONLY 12/21/2019 11/08/2019 08/24/2019  Score 0 3 2    ---------------------------------------------------------------------------------------------------  Social History   Tobacco Use  . Smoking status: Never Smoker  . Smokeless tobacco: Never Used  Substance Use Topics  . Alcohol use: Yes    Alcohol/week: 1.0 standard drinks    Types: 1 Standard drinks or equivalent per week    Comment: socially, maybe once monthly  . Drug use: No      Medications: Outpatient Medications Prior to Visit  Medication Sig  . levocetirizine (XYZAL) 5 MG tablet Take 1 tablet (5 mg total) by mouth every evening.  . ondansetron (ZOFRAN-ODT) 4 MG disintegrating tablet Take 1-2 tablets (4-8 mg total) by mouth every 8 (eight) hours as needed for nausea or vomiting.  Marland Kitchen QUEtiapine (SEROQUEL) 50 MG tablet Take 1 tablet (50 mg total) by mouth at bedtime.  Marland Kitchen venlafaxine XR (EFFEXOR XR) 75 MG 24 hr capsule Take 1 capsule (75 mg total) by mouth daily with breakfast.  . [DISCONTINUED] montelukast (SINGULAIR) 10 MG tablet Take 1 tablet (10  mg total) by mouth at bedtime.  . [DISCONTINUED] mupirocin ointment (BACTROBAN) 2 % PLACE ONE APPLICATION INTO THE NOSE TWO TIMES DAILY (Patient not taking: Reported on 12/21/2019)   No facility-administered medications prior to visit.      Review of Systems  Constitutional: Negative.   Respiratory: Negative.   Cardiovascular: Negative.   Neurological: Negative.   Psychiatric/Behavioral: Negative.       Objective    Wt 121 lb (54.9 kg)   LMP 02/25/2017   BMI 18.55  kg/m    Physical Exam     Assessment & Plan     Problem List Items Addressed This Visit      Respiratory   Allergic rhinitis   Relevant Medications   montelukast (SINGULAIR) 10 MG tablet     Other   MDD (major depressive disorder)    Chronic and in remission On Effexor for anxiety EAP for counseling       GAD (generalized anxiety disorder) - Primary    Chronic and well controlled Working with EAP Continue Seroquel prn for sleep Continue low dose Effexor      Nasal dryness   Relevant Medications   mupirocin ointment (BACTROBAN) 2 %       Return in about 6 weeks (around 02/01/2020) for CPE.     I discussed the assessment and treatment plan with the patient. The patient was provided an opportunity to ask questions and all were answered. The patient agreed with the plan and demonstrated an understanding of the instructions.   The patient was advised to call back or seek an in-person evaluation if the symptoms worsen or if the condition fails to improve as anticipated.   I, Courtney Latch, Rivers, have reviewed all documentation for this visit. The documentation on 12/21/19 for the exam, diagnosis, procedures, and orders are all accurate and complete.   Courtney Rivers, Courtney Schlein, Rivers, MPH [facility]

## 2020-02-06 ENCOUNTER — Ambulatory Visit
Admission: EM | Admit: 2020-02-06 | Discharge: 2020-02-06 | Disposition: A | Discharge status: Home / Self Care | Payer: 59 | Attending: Family Medicine | Admitting: Family Medicine

## 2020-02-06 ENCOUNTER — Other Ambulatory Visit: Payer: Self-pay

## 2020-02-06 DIAGNOSIS — R102 Pelvic and perineal pain: Secondary | ICD-10-CM

## 2020-02-06 MED ORDER — KETOROLAC TROMETHAMINE 60 MG/2ML IM SOLN
60.0000 mg | Freq: Once | INTRAMUSCULAR | Status: AC
  Administered 2020-02-06: 60 mg via INTRAMUSCULAR

## 2020-02-06 MED ORDER — KETOROLAC TROMETHAMINE 10 MG PO TABS: 10.0000 mg | ORAL_TABLET | Freq: Four times a day (QID) | ORAL | 0 refills | Status: DC | PRN

## 2020-02-06 NOTE — ED Triage Notes (Signed)
Pt states she is experiencing left pelvic pain starting this a.m at 6:00. Sharp in nature and radiates to bladder. No vomiting or diarrhea. "Feels like someone is cutting me."
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
## 2020-02-06 NOTE — Discharge Instructions (Signed)
Rest.  Fluids.  Medication as directed.  If persists, call GYN.  Take care  Dr. Adriana Simas

## 2020-02-08 ENCOUNTER — Encounter: Payer: Self-pay | Admitting: Family Medicine

## 2020-03-21 ENCOUNTER — Ambulatory Visit (INDEPENDENT_AMBULATORY_CARE_PROVIDER_SITE_OTHER): Payer: 59 | Admitting: Family Medicine

## 2020-03-21 ENCOUNTER — Other Ambulatory Visit: Payer: Self-pay

## 2020-03-21 ENCOUNTER — Encounter: Payer: Self-pay | Admitting: Family Medicine

## 2020-03-21 VITALS — BP 121/81 | HR 76 | Temp 98.4°F | Wt 124.6 lb

## 2020-03-21 DIAGNOSIS — M9902 Segmental and somatic dysfunction of thoracic region: Secondary | ICD-10-CM | POA: Diagnosis not present

## 2020-03-21 DIAGNOSIS — M9900 Segmental and somatic dysfunction of head region: Secondary | ICD-10-CM

## 2020-03-21 DIAGNOSIS — M9904 Segmental and somatic dysfunction of sacral region: Secondary | ICD-10-CM | POA: Diagnosis not present

## 2020-03-21 DIAGNOSIS — M542 Cervicalgia: Secondary | ICD-10-CM | POA: Diagnosis not present

## 2020-03-21 DIAGNOSIS — M9901 Segmental and somatic dysfunction of cervical region: Secondary | ICD-10-CM | POA: Diagnosis not present

## 2020-03-21 DIAGNOSIS — M9903 Segmental and somatic dysfunction of lumbar region: Secondary | ICD-10-CM | POA: Diagnosis not present

## 2020-03-21 DIAGNOSIS — M9905 Segmental and somatic dysfunction of pelvic region: Secondary | ICD-10-CM | POA: Diagnosis not present

## 2020-03-21 DIAGNOSIS — M9908 Segmental and somatic dysfunction of rib cage: Secondary | ICD-10-CM

## 2020-03-21 NOTE — Progress Notes (Signed)
BP 121/81 (BP Location: Left Arm, Patient Position: Sitting, Cuff Size: Normal)    Pulse 76    Temp 98.4 F (36.9 C) (Oral)    Wt 124 lb 9.6 oz (56.5 kg)    LMP 02/25/2017    SpO2 100%    BMI 19.52 kg/m    Subjective:    Patient ID: Courtney Rivers, female    DOB: 1986/07/06, 34 y.o.   MRN: [id number]  HPI: Courtney Rivers is a 34 y.o. female  Chief Complaint  Patient presents with   Pain    neck, back, ribs   Courtney Rivers presents today with pain in her neck. She notes that she woke up about 4 days ago with pain in her neck. She has not been able to turn her head. Her pain is in her neck. It came on quickly. It's tight and sharp. No radiation. Better with OMT and stretching. Worse with certain movements. She is otherwise doing well with no other concerns or complaints at this time.  Relevant past medical, surgical, family and social history reviewed and updated as indicated. Interim medical history since our last visit reviewed. Allergies and medications reviewed and updated.  Review of Systems  Constitutional: Negative.   Respiratory: Negative.   Cardiovascular: Negative.   Gastrointestinal: Negative.   Musculoskeletal: Positive for back pain, myalgias, neck pain and neck stiffness. Negative for arthralgias, gait problem and joint swelling.  Skin: Negative.   Neurological: Negative.   Psychiatric/Behavioral: Negative.     Per HPI unless specifically indicated above     Objective:    BP 121/81 (BP Location: Left Arm, Patient Position: Sitting, Cuff Size: Normal)    Pulse 76    Temp 98.4 F (36.9 C) (Oral)    Wt 124 lb 9.6 oz (56.5 kg)    LMP 02/25/2017    SpO2 100%    BMI 19.52 kg/m   Wt Readings from Last 3 Encounters:  03/21/20 124 lb 9.6 oz (56.5 kg)  02/06/20 123 lb (55.8 kg)  12/21/19 121 lb (54.9 kg)    Physical Exam Vitals and nursing note reviewed.  Constitutional:      General: She is not in acute distress.    Appearance: Normal appearance. She is  not ill-appearing.  HENT:     Head: Normocephalic and atraumatic.     Right Ear: External ear normal.     Left Ear: External ear normal.     Nose: Nose normal.     Mouth/Throat:     Mouth: Mucous membranes are moist.     Pharynx: Oropharynx is clear.  Eyes:     Extraocular Movements: Extraocular movements intact.     Conjunctiva/sclera: Conjunctivae normal.     Pupils: Pupils are equal, round, and reactive to light.  Neck:     Vascular: No carotid bruit.  Cardiovascular:     Rate and Rhythm: Normal rate.     Pulses: Normal pulses.  Pulmonary:     Effort: Pulmonary effort is normal. No respiratory distress.  Abdominal:     General: Abdomen is flat. There is no distension.     Palpations: Abdomen is soft. There is no mass.     Tenderness: There is no abdominal tenderness. There is no right CVA tenderness, left CVA tenderness, guarding or rebound.     Hernia: No hernia is present.  Musculoskeletal:     Cervical back: No muscular tenderness.  Lymphadenopathy:     Cervical: No cervical adenopathy.  Skin:  General: Skin is warm and dry.     Capillary Refill: Capillary refill takes less than 2 seconds.     Coloration: Skin is not jaundiced or pale.     Findings: No bruising, erythema, lesion or rash.  Neurological:     General: No focal deficit present.     Mental Status: She is alert. Mental status is at baseline.  Psychiatric:        Mood and Affect: Mood normal.        Behavior: Behavior normal.        Thought Content: Thought content normal.        Judgment: Judgment normal.    Musculoskeletal:  Exam found Decreased ROM, Tissue texture changes, Tenderness to palpation and Asymmetry of patient's  head, neck, thorax, ribs, lumbar, pelvis and sacrum Osteopathic Structural Exam:   Head: hypertonic suboccipital muscles, OAESSR  Neck: C3ESRR, C4ESRL  Thorax: T4-6SRRL  Ribs: Ribs 6-9 locked up on the L  Lumbar:  L4-5SRRL  Pelvis: Anterior R innominate  Sacrum: R on R  torsion  Results for orders placed or performed in visit on 02/10/19  Urine Culture   Specimen: Urine, Clean Catch   URINE  Result Value Ref Range   Urine Culture, Routine Final report    Organism ID, Bacteria No growth       Assessment & Plan:   Problem List Items Addressed This Visit    None    Visit Diagnoses    Neck pain    -  Primary   In acute exacerbation. She does have somatic dysfunction that is contributing to her symptoms. Treated today as below with good results.    Lumbar region somatic dysfunction       Sacral region somatic dysfunction       Rib cage region somatic dysfunction       Pelvic somatic dysfunction       Cervical segment dysfunction       Head region somatic dysfunction       Thoracic segment dysfunction        After verbal consent was obtained, patient was treated today with osteopathic manipulative medicine to the regions of the head, neck, thorax, ribs, lumbar, pelvis and sacrum using the techniques of cranial, myofascial release, counterstrain, muscle energy, HVLA and soft tissue. Areas of compensation relating to her primary pain source also treated. Patient tolerated the procedure well with good objective and good subjective improvement in symptoms. She left the room in good condition. She was advised to stay well hydrated and that she may have some soreness following the procedure. If not improving or worsening, she will call and come in. She will return for reevaluation  on a PRN basis.    Follow up plan: Return if symptoms worsen or fail to improve.

## 2020-03-22 NOTE — Progress Notes (Deleted)
     Established patient visit   Patient: Courtney Rivers   DOB: [birth year]   34 y.o. Female  MRN: [id number] Visit Date: 03/23/2020  Today's healthcare provider: Trey Sailors, PA-C   No chief complaint on file.  Subjective    HPI  Anxiety, Follow-up  She was last seen for anxiety 3 months ago. Changes made at last visit include no changes.   She reports {excellent/good/fair/poor:19665} compliance with treatment. She reports {good/fair/poor:18685} tolerance of treatment. She {is/is not:21021397} having side effects. {document side effects if present:1}  She feels her anxiety is {Desc; severity:60313} and {improved/worse/unchanged:3041574} since last visit.  Symptoms: {Yes/No:20286} chest pain {Yes/No:20286} difficulty concentrating  {Yes/No:20286} dizziness {Yes/No:20286} fatigue  {Yes/No:20286} feelings of losing control {Yes/No:20286} insomnia  {Yes/No:20286} irritable {Yes/No:20286} palpitations  {Yes/No:20286} panic attacks {Yes/No:20286} racing thoughts  {Yes/No:20286} shortness of breath {Yes/No:20286} sweating  {Yes/No:20286} tremors/shakes    GAD-7 Results GAD-7 Generalized Anxiety Disorder Screening Tool 12/21/2019 11/08/2019 08/24/2019  1. Feeling Nervous, Anxious, or on Edge 0 3 1  2. Not Being Able to Stop or Control Worrying 0 0 1  3. Worrying Too Much About Different Things 0 2 1  4. Trouble Relaxing 0 2 1  5. Being So Restless it's Hard To Sit Still 0 0 0  6. Becoming Easily Annoyed or Irritable 0 3 2  7. Feeling Afraid As If Something Awful Might Happen 0 0 0  Total GAD-7 Score 0 10 6  Difficulty At Work, Home, or Getting  Along With Others? Not difficult at all Very difficult Somewhat difficult    PHQ-9 Scores PHQ9 SCORE ONLY 12/21/2019 11/08/2019 08/24/2019  PHQ-9 Total Score 0 3 2     {Show patient history (optional):23778::" "}   Medications: Outpatient Medications Prior to Visit  Medication Sig  . ketorolac (TORADOL) 10 MG tablet Take  1 tablet (10 mg total) by mouth every 6 (six) hours as needed for moderate pain or severe pain.  Marland Kitchen levocetirizine (XYZAL) 5 MG tablet Take 1 tablet (5 mg total) by mouth every evening.  . montelukast (SINGULAIR) 10 MG tablet Take 1 tablet (10 mg total) by mouth at bedtime.  . ondansetron (ZOFRAN-ODT) 4 MG disintegrating tablet Take 1-2 tablets (4-8 mg total) by mouth every 8 (eight) hours as needed for nausea or vomiting.  Marland Kitchen QUEtiapine (SEROQUEL) 50 MG tablet Take 1 tablet (50 mg total) by mouth at bedtime.  Marland Kitchen venlafaxine XR (EFFEXOR XR) 75 MG 24 hr capsule Take 1 capsule (75 mg total) by mouth daily with breakfast.   No facility-administered medications prior to visit.    Review of Systems  {Heme  Chem  Endocrine  Serology  Results Review (optional):23779::" "}  Objective    LMP 02/25/2017  {Show previous vital signs (optional):23777::" "}  Physical Exam  ***  No results found for any visits on 03/23/20.  Assessment & Plan     ***  No follow-ups on file.      {provider attestation***:1}   Maryella Shivers  [facility] [phone] (phone) [phone] (fax)  [facility]

## 2020-03-23 ENCOUNTER — Ambulatory Visit: Payer: 59 | Admitting: Physician Assistant

## 2020-03-27 NOTE — Progress Notes (Signed)
MyChart Video Visit    Virtual Visit via Video Note   This visit type was conducted due to national recommendations for restrictions regarding the COVID-19 Pandemic (e.g. social distancing) in an effort to limit this patient's exposure and mitigate transmission in our community. This patient is at least at moderate risk for complications without adequate follow up. This format is felt to be most appropriate for this patient at this time. Physical exam was limited by quality of the video and audio technology used for the visit.   Patient location: Home Provider location: Office    I discussed the limitations of evaluation and management by telemedicine and the availability of in person appointments. The patient expressed understanding and agreed to proceed.    Patient: Courtney Rivers   DOB: 1986/05/22   34 y.o. Female  MRN: [id number] Visit Date: 03/28/2020  Today's healthcare provider: Trey Sailors, PA-C   Chief Complaint  Patient presents with  . Depression  . Anxiety  I,Minh Jasper M Diana Armijo,acting as a scribe for Trey Sailors, PA-C.,have documented all relevant documentation on the behalf of Trey Sailors, PA-C,as directed by  Trey Sailors, PA-C while in the presence of Trey Sailors, PA-C.  Subjective    HPI  Anxiety, Follow-up  She was last seen for anxiety 3 months ago. Changes made at last visit include restarting Effexor 75 mg daily. Continue Seroquel 50 mg daily. She feels the effexor does not make a difference whether she takes it or not.  She has historically tried Abilify, Buspar, zoloft, wellbutrin and abilify.    She reports good compliance with treatment. She reports good tolerance of treatment. She is not having side effects.   She feels her anxiety is mild and Improved since last visit.  She is having stressors including divorce, which is currently being finalized. 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 She feels she takes a lot of irritability to her house. Does not feel effexor is working and is only taking it every other day.   Symptoms: No chest pain No difficulty concentrating  No dizziness No fatigue  No feelings of losing control No insomnia  Yes irritable  No palpitations  Yes panic attacks No racing thoughts  No shortness of breath No sweating  No tremors/shakes    GAD-7 Results GAD-7 Generalized Anxiety Disorder Screening Tool 03/28/2020 12/21/2019 11/08/2019  1. Feeling Nervous, Anxious, or on Edge 1 0 3  2. Not Being Able to Stop or Control Worrying 0 0 0  3. Worrying Too Much About Different Things 0 0 2  4. Trouble Relaxing 0 0 2  5. Being So Restless it's Hard To Sit Still 0 0 0  6. Becoming Easily Annoyed or Irritable 0 0 3  7. Feeling Afraid As If Something Awful Might Happen 0 0 0  Total GAD-7 Score 1 0 10  Difficulty At Work, Home, or Getting  Along With Others? Not difficult at all Not difficult at all Very difficult    PHQ-9 Scores PHQ9 SCORE ONLY 03/28/2020 12/21/2019 11/08/2019  PHQ-9 Total Score 0 0 3    --------------------------------------------------------------------------------------------------- Depression, Follow-up  She  was last seen for this 3 months ago. Changes made at last visit include no change.   She reports good compliance with treatment. She is not having side effects.   She reports good tolerance of treatment. Current symptoms include: difficulty concentrating and insomnia She feels she is Improved  since last visit.  Depression screen [facility] 2/9 03/28/2020 12/21/2019 11/08/2019  Decreased Interest 0 0 0  Down, Depressed, Hopeless 0 0 0  PHQ - 2 Score 0 0 0  Altered sleeping 0 0 0  Tired, decreased energy 0 0 0  Change in appetite 0 0 2  Feeling bad or failure about yourself  0 0 0  Trouble concentrating 0 0 1  Moving slowly or fidgety/restless 0 0 0  Suicidal thoughts 0 0 0  PHQ-9 Score 0 0 3  Difficult  doing work/chores Not difficult at all Not difficult at all Very difficult  Some recent data might be hidden    -----------------------------------------------------------------------------------------     Medications: Outpatient Medications Prior to Visit  Medication Sig  . ketorolac (TORADOL) 10 MG tablet Take 1 tablet (10 mg total) by mouth every 6 (six) hours as needed for moderate pain or severe pain.  Marland Kitchen levocetirizine (XYZAL) 5 MG tablet Take 1 tablet (5 mg total) by mouth every evening.  . montelukast (SINGULAIR) 10 MG tablet Take 1 tablet (10 mg total) by mouth at bedtime.  . ondansetron (ZOFRAN-ODT) 4 MG disintegrating tablet Take 1-2 tablets (4-8 mg total) by mouth every 8 (eight) hours as needed for nausea or vomiting.  Marland Kitchen QUEtiapine (SEROQUEL) 50 MG tablet Take 1 tablet (50 mg total) by mouth at bedtime.  . [DISCONTINUED] venlafaxine XR (EFFEXOR XR) 75 MG 24 hr capsule Take 1 capsule (75 mg total) by mouth daily with breakfast.   No facility-administered medications prior to visit.    Review of Systems  Constitutional: Negative.   Respiratory: Negative.   Cardiovascular: Negative.   Hematological: Negative.   Psychiatric/Behavioral: Negative for agitation, decreased concentration, self-injury, sleep disturbance and suicidal ideas. The patient is nervous/anxious.       Objective    LMP 02/25/2017    Physical Exam Pulmonary:     Effort: Pulmonary effort is normal. No respiratory distress.  Neurological:     Mental Status: She is alert.  Psychiatric:        Mood and Affect: Mood normal.        Behavior: Behavior normal.        Assessment & Plan    1. Recurrent major depressive disorder, in full remission (HCC)  Taper down effexor, may take dosage below every other day for 2 weeks and stop. Start hydroxyzine. Establish with counselor. Follow up with PCP in 2 months.   - Ambulatory referral to Behavioral Health - venlafaxine (EFFEXOR) 37.5 MG tablet; Take 1  tablet (37.5 mg total) by mouth daily.  Dispense: 30 tablet; Refill: 0  2. GAD (generalized anxiety disorder)  - hydrOXYzine (ATARAX/VISTARIL) 10 MG tablet; Take 1 tablet (10 mg total) by mouth 3 (three) times daily as needed.  Dispense: 30 tablet; Refill: 1 - Ambulatory referral to Behavioral Health - venlafaxine (EFFEXOR) 37.5 MG tablet; Take 1 tablet (37.5 mg total) by mouth daily.  Dispense: 30 tablet; Refill: 0  3. Nasal sore  - mupirocin nasal ointment (BACTROBAN) 2 %; Place 1 application into the nose 2 (two) times daily. Use one-half of tube in each nostril twice daily for five (5) days. After application, press sides of nose together and gently massage.  Dispense: 10 g; Refill: 0      I discussed the assessment and treatment plan with the patient. The patient was provided an opportunity to ask questions and all were answered. The patient agreed with the plan and demonstrated an understanding of the instructions.  The patient was advised to call back or seek an in-person evaluation if the symptoms worsen or if the condition fails to improve as anticipated.     Maryella Shivers [facility] [phone] (phone) [phone] (fax)  [facility]

## 2020-03-28 ENCOUNTER — Telehealth (INDEPENDENT_AMBULATORY_CARE_PROVIDER_SITE_OTHER): Payer: 59 | Admitting: Physician Assistant

## 2020-03-28 DIAGNOSIS — F3342 Major depressive disorder, recurrent, in full remission: Secondary | ICD-10-CM

## 2020-03-28 DIAGNOSIS — F411 Generalized anxiety disorder: Secondary | ICD-10-CM

## 2020-03-28 DIAGNOSIS — J3489 Other specified disorders of nose and nasal sinuses: Secondary | ICD-10-CM

## 2020-03-28 MED ORDER — HYDROXYZINE HCL 10 MG PO TABS: 10.0000 mg | ORAL_TABLET | Freq: Three times a day (TID) | ORAL | 1 refills | Status: DC | PRN

## 2020-03-28 MED ORDER — MUPIROCIN CALCIUM 2 % NA OINT: 1.0000 "application " | TOPICAL_OINTMENT | Freq: Two times a day (BID) | NASAL | 0 refills | Status: DC

## 2020-03-28 MED ORDER — VENLAFAXINE HCL 37.5 MG PO TABS: 37.5000 mg | ORAL_TABLET | Freq: Every day | ORAL | 0 refills | Status: DC

## 2020-03-29 ENCOUNTER — Other Ambulatory Visit: Payer: Self-pay | Admitting: Physician Assistant

## 2020-03-29 DIAGNOSIS — J3489 Other specified disorders of nose and nasal sinuses: Secondary | ICD-10-CM

## 2020-03-29 MED ORDER — MUPIROCIN 2 % EX OINT: 1.0000 "application " | TOPICAL_OINTMENT | Freq: Two times a day (BID) | CUTANEOUS | 0 refills | Status: DC

## 2020-04-04 ENCOUNTER — Encounter: Payer: 59 | Admitting: Physician Assistant

## 2020-05-02 ENCOUNTER — Ambulatory Visit (INDEPENDENT_AMBULATORY_CARE_PROVIDER_SITE_OTHER): Payer: 59 | Admitting: Psychology

## 2020-05-02 DIAGNOSIS — F418 Other specified anxiety disorders: Secondary | ICD-10-CM | POA: Diagnosis not present

## 2020-05-21 ENCOUNTER — Ambulatory Visit (INDEPENDENT_AMBULATORY_CARE_PROVIDER_SITE_OTHER): Payer: 59 | Admitting: Psychology

## 2020-05-21 DIAGNOSIS — F418 Other specified anxiety disorders: Secondary | ICD-10-CM

## 2020-06-06 ENCOUNTER — Ambulatory Visit: Payer: 59 | Admitting: Psychology

## 2020-06-21 ENCOUNTER — Ambulatory Visit: Payer: 59 | Admitting: Family Medicine

## 2020-06-21 ENCOUNTER — Encounter: Payer: Self-pay | Admitting: Family Medicine

## 2020-06-21 ENCOUNTER — Other Ambulatory Visit: Payer: Self-pay

## 2020-06-21 VITALS — BP 101/69 | HR 83 | Temp 98.5°F | Ht 68.11 in | Wt 128.0 lb

## 2020-06-21 DIAGNOSIS — M9908 Segmental and somatic dysfunction of rib cage: Secondary | ICD-10-CM

## 2020-06-21 DIAGNOSIS — M545 Low back pain, unspecified: Secondary | ICD-10-CM | POA: Diagnosis not present

## 2020-06-21 DIAGNOSIS — M9901 Segmental and somatic dysfunction of cervical region: Secondary | ICD-10-CM

## 2020-06-21 DIAGNOSIS — M9902 Segmental and somatic dysfunction of thoracic region: Secondary | ICD-10-CM

## 2020-06-21 DIAGNOSIS — M9905 Segmental and somatic dysfunction of pelvic region: Secondary | ICD-10-CM

## 2020-06-21 DIAGNOSIS — M9904 Segmental and somatic dysfunction of sacral region: Secondary | ICD-10-CM | POA: Diagnosis not present

## 2020-06-21 DIAGNOSIS — M9903 Segmental and somatic dysfunction of lumbar region: Secondary | ICD-10-CM

## 2020-06-21 DIAGNOSIS — M9909 Segmental and somatic dysfunction of abdomen and other regions: Secondary | ICD-10-CM | POA: Diagnosis not present

## 2020-06-21 NOTE — Progress Notes (Signed)
BP 101/69   Pulse 83   Temp 98.5 F (36.9 C) (Oral)   Ht 5' 8.11" (1.73 m)   Wt 128 lb (58.1 kg)   LMP 02/25/2017   SpO2 100%   BMI 19.40 kg/m    Subjective:    Patient ID: Courtney Rivers, female    DOB: July 08, 1986, 34 y.o.   MRN: [id number]  HPI: Courtney Rivers is a 34 y.o. female  Chief Complaint  Patient presents with  . Back Pain  . Neck Pain   Courtney Rivers presents today for evaluation and possible treatment with OMT. She notes that she was in her usual state of health when she did something to her back about 3 weeks ago. It's been in her low back. It's aching and sore and shooting down into her bottom. Pain is worse with movement and better with rest. She has otherwise been feeling well. She finds OMT to be helpful and would like to pursue it today. No other concerns or complaints at this time.   Relevant past medical, surgical, family and social history reviewed and updated as indicated. Interim medical history since our last visit reviewed. Allergies and medications reviewed and updated.  Review of Systems  Constitutional: Negative.   Respiratory: Negative.   Cardiovascular: Negative.   Gastrointestinal: Negative.   Musculoskeletal: Positive for back pain and myalgias. Negative for arthralgias, gait problem, joint swelling, neck pain and neck stiffness.  Skin: Negative.   Neurological: Negative.   Psychiatric/Behavioral: Negative.     Per HPI unless specifically indicated above     Objective:    BP 101/69   Pulse 83   Temp 98.5 F (36.9 C) (Oral)   Ht 5' 8.11" (1.73 m)   Wt 128 lb (58.1 kg)   LMP 02/25/2017   SpO2 100%   BMI 19.40 kg/m   Wt Readings from Last 3 Encounters:  06/21/20 128 lb (58.1 kg)  03/21/20 124 lb 9.6 oz (56.5 kg)  02/06/20 123 lb (55.8 kg)    Physical Exam Vitals and nursing note reviewed.  Constitutional:      General: She is not in acute distress.    Appearance: Normal appearance. She is not ill-appearing.  HENT:      Head: Normocephalic and atraumatic.     Right Ear: External ear normal.     Left Ear: External ear normal.     Nose: Nose normal.     Mouth/Throat:     Mouth: Mucous membranes are moist.     Pharynx: Oropharynx is clear.  Eyes:     Extraocular Movements: Extraocular movements intact.     Conjunctiva/sclera: Conjunctivae normal.     Pupils: Pupils are equal, round, and reactive to light.  Neck:     Vascular: No carotid bruit.  Cardiovascular:     Rate and Rhythm: Normal rate.     Pulses: Normal pulses.  Pulmonary:     Effort: Pulmonary effort is normal. No respiratory distress.  Abdominal:     General: Abdomen is flat. There is no distension.     Palpations: Abdomen is soft. There is no mass.     Tenderness: There is no abdominal tenderness. There is no right CVA tenderness, left CVA tenderness, guarding or rebound.     Hernia: No hernia is present.  Musculoskeletal:     Cervical back: No muscular tenderness.  Lymphadenopathy:     Cervical: No cervical adenopathy.  Skin:    General: Skin is warm and dry.  Capillary Refill: Capillary refill takes less than 2 seconds.     Coloration: Skin is not jaundiced or pale.     Findings: No bruising, erythema, lesion or rash.  Neurological:     General: No focal deficit present.     Mental Status: She is alert. Mental status is at baseline.  Psychiatric:        Mood and Affect: Mood normal.        Behavior: Behavior normal.        Thought Content: Thought content normal.        Judgment: Judgment normal.   Musculoskeletal:  Exam found Decreased ROM, Tissue texture changes, Tenderness to palpation and Asymmetry of patient's  neck, thorax, ribs, lumbar, pelvis, sacrum and abdomen Osteopathic Structural Exam:   Neck: C3ESRR, C4ESRL  Thorax: T4-6SRRL  Ribs: Ribs 10-12 locked up on the L, Rib 7 locked up on the R  Lumbar: L3-5SLRR, QL hypertonic on the R  Pelvis: Anterior R innominate, pubic bone restricted on the R  Sacrum: R  on R torsion  Abdomen: diaphragm hypertonic L>R   Results for orders placed or performed in visit on 02/10/19  Urine Culture   Specimen: Urine, Clean Catch   URINE  Result Value Ref Range   Urine Culture, Routine Final report    Organism ID, Bacteria No growth       Assessment & Plan:   Problem List Items Addressed This Visit    None    Visit Diagnoses    Acute bilateral low back pain without sciatica    -  Primary   In acute exacerbation. She does have somatic dysfunction contributing to her symptoms. Treated today with good results as below.    Sacral region somatic dysfunction       Lumbar region somatic dysfunction       Rib cage region somatic dysfunction       Pelvic somatic dysfunction       Cervical segment dysfunction       Thoracic segment dysfunction       Segmental dysfunction of abdomen         After verbal consent was obtained, patient was treated today with osteopathic manipulative medicine to the regions of the neck, thorax, ribs, lumbar, pelvis, sacrum and abdomen using the techniques of myofascial release, counterstrain, muscle energy, HVLA and soft tissue. Areas of compensation relating to her primary pain source also treated. Patient tolerated the procedure well with good objective and good subjective improvement in symptoms. She left the room in good condition. She was advised to stay well hydrated and that She may have some soreness following the procedure. If not improving or worsening, she will call and come in. She will return for reevaluation  on a PRN basis.   Follow up plan: Return if symptoms worsen or fail to improve.

## 2020-08-01 ENCOUNTER — Ambulatory Visit (INDEPENDENT_AMBULATORY_CARE_PROVIDER_SITE_OTHER): Payer: 59 | Admitting: Obstetrics & Gynecology

## 2020-08-01 ENCOUNTER — Other Ambulatory Visit (HOSPITAL_COMMUNITY)
Admission: RE | Admit: 2020-08-01 | Discharge: 2020-08-01 | Disposition: A | Discharge status: Home / Self Care | Payer: 59 | Source: Ambulatory Visit | Attending: Obstetrics & Gynecology | Admitting: Obstetrics & Gynecology

## 2020-08-01 ENCOUNTER — Other Ambulatory Visit: Payer: Self-pay

## 2020-08-01 ENCOUNTER — Encounter: Payer: Self-pay | Admitting: Obstetrics & Gynecology

## 2020-08-01 VITALS — BP 100/70 | Ht 69.0 in | Wt 129.0 lb

## 2020-08-01 DIAGNOSIS — Z1272 Encounter for screening for malignant neoplasm of vagina: Secondary | ICD-10-CM

## 2020-08-01 DIAGNOSIS — Z113 Encounter for screening for infections with a predominantly sexual mode of transmission: Secondary | ICD-10-CM

## 2020-08-01 DIAGNOSIS — Z01419 Encounter for gynecological examination (general) (routine) without abnormal findings: Secondary | ICD-10-CM

## 2020-08-01 DIAGNOSIS — Z202 Contact with and (suspected) exposure to infections with a predominantly sexual mode of transmission: Secondary | ICD-10-CM | POA: Diagnosis not present
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
## 2020-08-01 NOTE — Patient Instructions (Signed)
Thank you for choosing [facility]. As part of our ongoing efforts to improve patient experience, we would appreciate your feedback. Please fill out the short survey that you will receive by mail or MyChart. Your opinion is important to us! -Dr Shaela Boer  Recommendations to boost your immunity to prevent illness such as viral flu and colds, including covid19, are as follows:       - - -  Vitamin K2 and Vitamin D3  - - - Take Vitamin K2 at 200-300 mcg daily (usually 2-3 pills daily of the over the counter formulation). Take Vitamin D3 at 3000-4000 U daily (usually 3-4 pills daily of the over the counter formulation). Studies show that these two at high normal levels in your system are very effective in keeping your immunity so strong and protective that you will be unlikely to contract viral illness such as those listed above.  Dr Nashanti Duquette  

## 2020-08-03 LAB — RPR+HSVIGM+HBSAG+HSV2(IGG)+...
HIV Screen 4th Generation wRfx: NONREACTIVE
HSV 2 IgG, Type Spec: <0.91 index (ref 0.00–0.90)
HSVI/II Comb IgM: <0.91 Ratio (ref 0.00–0.90)
Hepatitis B Surface Ag: NEGATIVE
RPR Ser Ql: NONREACTIVE

## 2020-08-06 LAB — CYTOLOGY - PAP
Chlamydia: NEGATIVE
Comment: NEGATIVE
Comment: NEGATIVE
Comment: NORMAL
Diagnosis: NEGATIVE
Neisseria Gonorrhea: NEGATIVE
Trichomonas: NEGATIVE

## 2020-08-21 ENCOUNTER — Ambulatory Visit: Payer: Medicaid Other | Admitting: Obstetrics & Gynecology

## 2020-08-21 DIAGNOSIS — Z03818 Encounter for observation for suspected exposure to other biological agents ruled out: Secondary | ICD-10-CM | POA: Diagnosis not present

## 2020-08-21 DIAGNOSIS — Z20822 Contact with and (suspected) exposure to covid-19: Secondary | ICD-10-CM | POA: Diagnosis not present

## 2020-09-13 ENCOUNTER — Other Ambulatory Visit: Payer: Self-pay | Admitting: Obstetrics & Gynecology

## 2020-09-22 DIAGNOSIS — M25361 Other instability, right knee: Secondary | ICD-10-CM | POA: Diagnosis not present

## 2020-09-22 DIAGNOSIS — M25562 Pain in left knee: Secondary | ICD-10-CM | POA: Diagnosis not present

## 2020-09-22 DIAGNOSIS — M25561 Pain in right knee: Secondary | ICD-10-CM | POA: Diagnosis not present

## 2020-12-27 ENCOUNTER — Ambulatory Visit: Payer: Self-pay | Admitting: Family Medicine

## 2021-01-18 ENCOUNTER — Ambulatory Visit (INDEPENDENT_AMBULATORY_CARE_PROVIDER_SITE_OTHER): Payer: 59 | Admitting: Family Medicine

## 2021-01-18 ENCOUNTER — Other Ambulatory Visit: Payer: Self-pay

## 2021-01-18 ENCOUNTER — Encounter: Payer: Self-pay | Admitting: Family Medicine

## 2021-01-18 VITALS — BP 102/69 | HR 69 | Temp 98.6°F | Ht 69.0 in | Wt 127.4 lb

## 2021-01-18 DIAGNOSIS — M9903 Segmental and somatic dysfunction of lumbar region: Secondary | ICD-10-CM

## 2021-01-18 DIAGNOSIS — M545 Low back pain, unspecified: Secondary | ICD-10-CM | POA: Diagnosis not present

## 2021-01-18 DIAGNOSIS — M9902 Segmental and somatic dysfunction of thoracic region: Secondary | ICD-10-CM | POA: Diagnosis not present

## 2021-01-18 DIAGNOSIS — M9900 Segmental and somatic dysfunction of head region: Secondary | ICD-10-CM

## 2021-01-18 DIAGNOSIS — M9908 Segmental and somatic dysfunction of rib cage: Secondary | ICD-10-CM

## 2021-01-18 DIAGNOSIS — M9905 Segmental and somatic dysfunction of pelvic region: Secondary | ICD-10-CM

## 2021-01-18 DIAGNOSIS — M9904 Segmental and somatic dysfunction of sacral region: Secondary | ICD-10-CM | POA: Diagnosis not present

## 2021-01-18 DIAGNOSIS — M9901 Segmental and somatic dysfunction of cervical region: Secondary | ICD-10-CM

## 2021-01-18 NOTE — Progress Notes (Signed)
BP 102/69   Pulse 69   Temp 98.6 F (37 C) (Oral)   Ht 5\' 9"  (1.753 m)   Wt 127 lb 6.4 oz (57.8 kg)   LMP 02/25/2017   SpO2 99%   BMI 18.81 kg/m    Subjective:    Patient ID: Courtney Rivers, female    DOB: 03/21/1986, 35 y.o.   MRN: [id number]  HPI: Courtney Rivers is a 35 y.o. female  Chief Complaint  Patient presents with   Back Pain   Courtney Rivers comes in today for evaluation and possible treatment with OMT for back pain. She moved last weekend and has not been feeling like herself since then. She notes that her pain is aching and sore. Better with rest and OMT. Worse with certain movements. She notes that she is having some shooting pains down her legs. She has not had any numbness or tingling. She is otherwise doing well with no other concerns or complaints at this time.   Relevant past medical, surgical, family and social history reviewed and updated as indicated. Interim medical history since our last visit reviewed. Allergies and medications reviewed and updated.  Review of Systems  Constitutional: Negative.   Respiratory: Negative.    Cardiovascular: Negative.   Gastrointestinal: Negative.   Musculoskeletal:  Positive for back pain and myalgias. Negative for arthralgias, gait problem, joint swelling, neck pain and neck stiffness.  Psychiatric/Behavioral: Negative.     Per HPI unless specifically indicated above     Objective:    BP 102/69   Pulse 69   Temp 98.6 F (37 C) (Oral)   Ht 5\' 9"  (1.753 m)   Wt 127 lb 6.4 oz (57.8 kg)   LMP 02/25/2017   SpO2 99%   BMI 18.81 kg/m   Wt Readings from Last 3 Encounters:  01/18/21 127 lb 6.4 oz (57.8 kg)  08/01/20 129 lb (58.5 kg)  06/21/20 128 lb (58.1 kg)    Physical Exam Vitals and nursing note reviewed.  Constitutional:      General: She is not in acute distress.    Appearance: Normal appearance. She is not ill-appearing.  HENT:     Head: Normocephalic and atraumatic.     Right Ear: External  ear normal.     Left Ear: External ear normal.     Nose: Nose normal.     Mouth/Throat:     Mouth: Mucous membranes are moist.     Pharynx: Oropharynx is clear.  Eyes:     Extraocular Movements: Extraocular movements intact.     Conjunctiva/sclera: Conjunctivae normal.     Pupils: Pupils are equal, round, and reactive to light.  Neck:     Vascular: No carotid bruit.  Cardiovascular:     Rate and Rhythm: Normal rate.     Pulses: Normal pulses.  Pulmonary:     Effort: Pulmonary effort is normal. No respiratory distress.  Abdominal:     General: Abdomen is flat. There is no distension.     Palpations: Abdomen is soft. There is no mass.     Tenderness: There is no abdominal tenderness. There is no right CVA tenderness, left CVA tenderness, guarding or rebound.     Hernia: No hernia is present.  Musculoskeletal:     Cervical back: No muscular tenderness.  Lymphadenopathy:     Cervical: No cervical adenopathy.  Skin:    General: Skin is warm and dry.     Capillary Refill: Capillary refill takes less than 2 seconds.  Coloration: Skin is not jaundiced or pale.     Findings: No bruising, erythema, lesion or rash.  Neurological:     General: No focal deficit present.     Mental Status: She is alert. Mental status is at baseline.  Psychiatric:        Mood and Affect: Mood normal.        Behavior: Behavior normal.        Thought Content: Thought content normal.        Judgment: Judgment normal.  Musculoskeletal:  Exam found Decreased ROM, Tissue texture changes, Tenderness to palpation, and Asymmetry of patient's  head, neck, thorax, ribs, lumbar, pelvis, and sacrum Osteopathic Structural Exam:   Head: hypertonic suboccipital muscles  Neck: C3ESRR, C4ESRL  Thorax: T3-5SLRR, trap hypertonic bilaterally  Ribs: Ribs 6-8 locked up on the R, rib 7 locked up on L  Lumbar: L3-5SLRR, QL hypertonic on the R  Pelvis: Anterior R innominate  Sacrum: R on R torsion   Results for orders  placed or performed in visit on 08/01/20  STD Panel  Result Value Ref Range   Hepatitis B Surface Ag Negative Negative   RPR Ser Ql Non Reactive Non Reactive   HIV Screen 4th Generation wRfx Non Reactive Non Reactive   HSVI/II Comb IgM <0.91 0.00 - 0.90 Ratio   HSV 2 IgG, Type Spec <0.91 0.00 - 0.90 index  Cytology - PAP  Result Value Ref Range   Neisseria Gonorrhea Negative    Chlamydia Negative    Trichomonas Negative    Adequacy Satisfactory for evaluation.    Diagnosis      - Negative for intraepithelial lesion or malignancy (NILM)   Microorganisms Shift in flora suggestive of bacterial vaginosis    Comment Normal Reference Range Trichomonas - Negative    Comment Normal Reference Ranger Chlamydia - Negative    Comment      Normal Reference Range Neisseria Gonorrhea - Negative      Assessment & Plan:   Problem List Items Addressed This Visit   None Visit Diagnoses     Acute bilateral low back pain without sciatica    -  Primary   In acute exacerbation due to moving. She does have somatic dysfunction that is contributing to her symptoms. Treated today with good results as below.    Sacral region somatic dysfunction       Lumbar region somatic dysfunction       Rib cage region somatic dysfunction       Pelvic somatic dysfunction       Cervical segment dysfunction       Thoracic segment dysfunction       Head region somatic dysfunction           After verbal consent was obtained, patient was treated today with osteopathic manipulative medicine to the regions of the head, neck, thorax, ribs, lumbar, pelvis, and sacrum using the techniques of cranial, myofascial release, counterstrain, muscle energy, HVLA, and soft tissue. Areas of compensation relating to her primary pain source also treated. Patient tolerated the procedure well with good objective and good subjective improvement in symptoms. She left the room in good condition. She was advised to stay well hydrated and that  she may have some soreness following the procedure. If not improving or worsening, she will call and come in. She will return for reevaluation  on a PRN basis.  Follow up plan: Return if symptoms worsen or fail to improve.

## 2021-03-01 ENCOUNTER — Ambulatory Visit: Payer: 59

## 2021-05-30 ENCOUNTER — Ambulatory Visit (INDEPENDENT_AMBULATORY_CARE_PROVIDER_SITE_OTHER): Payer: 59 | Admitting: Family Medicine

## 2021-05-30 ENCOUNTER — Encounter: Payer: Self-pay | Admitting: Family Medicine

## 2021-05-30 ENCOUNTER — Other Ambulatory Visit: Payer: Self-pay

## 2021-05-30 VITALS — BP 104/73 | HR 78 | Ht 69.0 in | Wt 129.0 lb

## 2021-05-30 DIAGNOSIS — F3342 Major depressive disorder, recurrent, in full remission: Secondary | ICD-10-CM

## 2021-05-30 DIAGNOSIS — K219 Gastro-esophageal reflux disease without esophagitis: Secondary | ICD-10-CM

## 2021-05-30 DIAGNOSIS — F411 Generalized anxiety disorder: Secondary | ICD-10-CM

## 2021-05-30 DIAGNOSIS — J340 Abscess, furuncle and carbuncle of nose: Secondary | ICD-10-CM

## 2021-05-30 DIAGNOSIS — J301 Allergic rhinitis due to pollen: Secondary | ICD-10-CM

## 2021-05-30 DIAGNOSIS — Z113 Encounter for screening for infections with a predominantly sexual mode of transmission: Secondary | ICD-10-CM | POA: Diagnosis not present

## 2021-05-30 DIAGNOSIS — Z13220 Encounter for screening for lipoid disorders: Secondary | ICD-10-CM | POA: Diagnosis not present

## 2021-05-30 LAB — URINALYSIS, ROUTINE W REFLEX MICROSCOPIC
Bilirubin, UA: NEGATIVE
Glucose, UA: NEGATIVE
Ketones, UA: NEGATIVE
Leukocytes,UA: NEGATIVE
Nitrite, UA: NEGATIVE
RBC, UA: NEGATIVE
Specific Gravity, UA: 1.025 (ref 1.005–1.030)
Urobilinogen, Ur: 1 mg/dL (ref 0.2–1.0)
pH, UA: 7 (ref 5.0–7.5)

## 2021-05-30 LAB — MICROSCOPIC EXAMINATION
RBC, Urine: NONE SEEN /hpf (ref 0–2)
WBC, UA: NONE SEEN /hpf (ref 0–5)

## 2021-05-30 MED ORDER — VENLAFAXINE HCL ER 37.5 MG PO CP24
37.5000 mg | ORAL_CAPSULE | Freq: Every day | ORAL | 3 refills | Status: DC
Start: 2021-05-30 — End: 2021-11-28

## 2021-05-30 MED ORDER — HYDROXYZINE HCL 10 MG PO TABS: 10.0000 mg | ORAL_TABLET | Freq: Three times a day (TID) | ORAL | 6 refills | Status: DC | PRN

## 2021-05-30 MED ORDER — MONTELUKAST SODIUM 10 MG PO TABS: 10.0000 mg | ORAL_TABLET | Freq: Every day | ORAL | 3 refills | Status: DC

## 2021-05-30 MED ORDER — ONDANSETRON 4 MG PO TBDP: 4.0000 mg | ORAL_TABLET | Freq: Three times a day (TID) | ORAL | 3 refills | Status: DC | PRN

## 2021-05-30 MED ORDER — QUETIAPINE FUMARATE 25 MG PO TABS: 25.0000 mg | ORAL_TABLET | Freq: Every evening | ORAL | 2 refills | Status: DC | PRN

## 2021-05-30 NOTE — Assessment & Plan Note (Signed)
Doing well. Labs drawn today. Await results.

## 2021-05-30 NOTE — Assessment & Plan Note (Signed)
Not doing great. Will restart meds at lower doses and recheck 1 month. Call with any concerns.  

## 2021-05-30 NOTE — Assessment & Plan Note (Signed)
Doing well. Labs drawn today. Await results.  

## 2021-05-30 NOTE — Assessment & Plan Note (Signed)
Not doing great. Will restart meds at lower doses and recheck 1 month. Call with any concerns.
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
## 2021-05-31 ENCOUNTER — Ambulatory Visit: Payer: Self-pay | Admitting: *Deleted

## 2021-05-31 LAB — TSH: TSH: 1.13 u[IU]/mL (ref 0.450–4.500)

## 2021-05-31 LAB — COMPREHENSIVE METABOLIC PANEL
ALT: 18 IU/L (ref 0–32)
AST: 23 IU/L (ref 0–40)
Albumin/Globulin Ratio: 1.7 (ref 1.2–2.2)
Albumin: 5 g/dL — ABNORMAL HIGH (ref 3.8–4.8)
Alkaline Phosphatase: 65 IU/L (ref 44–121)
BUN/Creatinine Ratio: 11 (ref 9–23)
BUN: 8 mg/dL (ref 6–20)
Bilirubin Total: 0.7 mg/dL (ref 0.0–1.2)
CO2: 22 mmol/L (ref 20–29)
Calcium: 9.5 mg/dL (ref 8.7–10.2)
Chloride: 104 mmol/L (ref 96–106)
Creatinine, Ser: 0.71 mg/dL (ref 0.57–1.00)
Globulin, Total: 2.9 g/dL (ref 1.5–4.5)
Glucose: 81 mg/dL (ref 70–99)
Potassium: 4.2 mmol/L (ref 3.5–5.2)
Sodium: 141 mmol/L (ref 134–144)
Total Protein: 7.9 g/dL (ref 6.0–8.5)
eGFR: 114 mL/min/{1.73_m2} (ref >59)

## 2021-05-31 LAB — CBC WITH DIFFERENTIAL/PLATELET
Basophils Absolute: 0 10*3/uL (ref 0.0–0.2)
Basos: 1 %
EOS (ABSOLUTE): 0 10*3/uL (ref 0.0–0.4)
Eos: 1 %
Hematocrit: 37.8 % (ref 34.0–46.6)
Hemoglobin: 12.1 g/dL (ref 11.1–15.9)
Immature Grans (Abs): 0 10*3/uL (ref 0.0–0.1)
Immature Granulocytes: 0 %
Lymphocytes Absolute: 1.6 10*3/uL (ref 0.7–3.1)
Lymphs: 37 %
MCH: 28.9 pg (ref 26.6–33.0)
MCHC: 32 g/dL (ref 31.5–35.7)
MCV: 90 fL (ref 79–97)
Monocytes Absolute: 0.4 10*3/uL (ref 0.1–0.9)
Monocytes: 9 %
Neutrophils Absolute: 2.3 10*3/uL (ref 1.4–7.0)
Neutrophils: 52 %
Platelets: 301 10*3/uL (ref 150–450)
RBC: 4.18 x10E6/uL (ref 3.77–5.28)
RDW: 12.2 % (ref 11.7–15.4)
WBC: 4.3 10*3/uL (ref 3.4–10.8)

## 2021-05-31 LAB — RPR: RPR Ser Ql: NONREACTIVE

## 2021-05-31 LAB — ACUTE VIRAL HEPATITIS (HAV, HBV, HCV)
HCV Ab: <0.1 s/co ratio (ref 0.0–0.9)
Hep A IgM: NEGATIVE
Hep B C IgM: NEGATIVE
Hepatitis B Surface Ag: NEGATIVE

## 2021-05-31 LAB — LIPID PANEL W/O CHOL/HDL RATIO
Cholesterol, Total: 183 mg/dL (ref 100–199)
HDL: 68 mg/dL (ref >39)
LDL Chol Calc (NIH): 105 mg/dL — ABNORMAL HIGH (ref 0–99)
Triglycerides: 50 mg/dL (ref 0–149)
VLDL Cholesterol Cal: 10 mg/dL (ref 5–40)

## 2021-05-31 LAB — HCV INTERPRETATION

## 2021-05-31 LAB — HIV ANTIBODY (ROUTINE TESTING W REFLEX): HIV Screen 4th Generation wRfx: NONREACTIVE

## 2021-05-31 NOTE — Telephone Encounter (Signed)
Summary: Lab results, questions   Pt has questions about her lab results and wants to speak to a nurse today, please advise  Best contact: [phone]   ----- Message from Randol Kern sent at 05/31/2021  1:26 PM EDT -----  Pt has questions about her lab results and wants to speak to a nurse today, please advise  Best contact: [phone]      Reason for Disposition  Caller requesting lab results  (Exception: Routine or non-urgent lab result.)  Answer Assessment - Initial Assessment Questions 1. REASON FOR CALL or QUESTION: "What is your reason for calling today?" or "How can I best help you?" or "What question do you have that I can help answer?"     Patient states she saw her labs in MyChart- and just had a couple questions about - Albumin level and LDL level. Patient advised would let provider know- discussed hydrating and eating healthy diet. Patient states she is working on being better at that.  Protocols used: Information Only Call - No Triage-A-AH, PCP Call - No Triage-A-AH

## 2021-05-31 NOTE — Telephone Encounter (Signed)
Patient is calling - she received some her of lab results in MyChart- and saw a couple abnormal results- she wanted to make sure everything was ok. Advised would let provider know.

## 2021-06-01 LAB — GC/CHLAMYDIA PROBE AMP
Chlamydia trachomatis, NAA: NEGATIVE
Neisseria Gonorrhoeae by PCR: NEGATIVE

## 2021-06-03 NOTE — Telephone Encounter (Signed)
Please see below.

## 2021-06-04 NOTE — Telephone Encounter (Signed)
Those are nothing to worry about, can happen if she ate that day

## 2021-06-04 NOTE — Telephone Encounter (Signed)
Please advise 

## 2021-06-05 NOTE — Telephone Encounter (Signed)
Patient notified

## 2021-07-03 DIAGNOSIS — J340 Abscess, furuncle and carbuncle of nose: Secondary | ICD-10-CM | POA: Diagnosis not present

## 2021-07-03 DIAGNOSIS — J301 Allergic rhinitis due to pollen: Secondary | ICD-10-CM | POA: Diagnosis not present

## 2021-11-28 ENCOUNTER — Encounter: Payer: Self-pay | Admitting: Family Medicine

## 2021-11-28 ENCOUNTER — Ambulatory Visit: Payer: 59 | Admitting: Family Medicine

## 2021-11-28 VITALS — BP 111/77 | HR 97 | Temp 97.9°F | Wt 139.0 lb

## 2021-11-28 DIAGNOSIS — F3342 Major depressive disorder, recurrent, in full remission: Secondary | ICD-10-CM

## 2021-11-28 DIAGNOSIS — J0100 Acute maxillary sinusitis, unspecified: Secondary | ICD-10-CM | POA: Diagnosis not present

## 2021-11-28 DIAGNOSIS — F411 Generalized anxiety disorder: Secondary | ICD-10-CM | POA: Diagnosis not present

## 2021-11-28 MED ORDER — PREDNISONE 50 MG PO TABS: 50.0000 mg | ORAL_TABLET | Freq: Every day | ORAL | 0 refills | Status: DC

## 2021-11-28 MED ORDER — ONDANSETRON 4 MG PO TBDP: 4.0000 mg | ORAL_TABLET | Freq: Three times a day (TID) | ORAL | 3 refills | Status: DC | PRN

## 2021-11-28 MED ORDER — QUETIAPINE FUMARATE 25 MG PO TABS: 25.0000 mg | ORAL_TABLET | Freq: Every evening | ORAL | 2 refills | Status: DC | PRN

## 2021-11-28 MED ORDER — HYDROXYZINE HCL 10 MG PO TABS: 10.0000 mg | ORAL_TABLET | Freq: Three times a day (TID) | ORAL | 6 refills | Status: DC | PRN

## 2021-11-28 MED ORDER — DOXYCYCLINE HYCLATE 100 MG PO TABS: 100.0000 mg | ORAL_TABLET | Freq: Two times a day (BID) | ORAL | 0 refills | Status: DC

## 2021-11-28 NOTE — Assessment & Plan Note (Signed)
Under good control on current regimen. Continue current regimen. Continue to monitor. Call with any concerns. Refills given.   

## 2021-11-28 NOTE — Progress Notes (Signed)
? ?BP 111/77   Pulse 97   Temp 97.9 ?F (36.6 ?C)   Wt 139 lb (63 kg)   LMP 02/25/2017   SpO2 100%   BMI 20.53 kg/m?   ? ?Subjective:  ? ? Patient ID: Courtney Rivers, female    DOB: 08-Jun-1986, 36 y.o.   MRN: [id number] ? ?HPI: ?Courtney Rivers is a 36 y.o. female ? ?Chief Complaint  ?Patient presents with  ? Sinusitis  ?  Patient states she is having facial pressure and sinus drainage, has been using OTC mucinex   ? ?UPPER RESPIRATORY TRACT INFECTION ?Duration: 2 days ?Worst symptom: facial pressure ?Fever: no ?Cough: no ?Shortness of breath: no ?Wheezing: no ?Chest pain: yes, with cough ?Chest tightness: no ?Chest congestion: no ?Nasal congestion: yes ?Runny nose: yes ?Post nasal drip: yes ?Sneezing: yes ?Sore throat: yes ?Swollen glands: yes ?Sinus pressure: yes ?Headache: no ?Face pain: no ?Toothache: no ?Ear pain: no  ?Ear pressure: no  ?Eyes red/itching:no ?Eye drainage/crusting: no  ?Vomiting: no ?Rash: no ?Fatigue: yes ?Sick contacts: no ?Strep contacts: no  ?Context: stable ?Recurrent sinusitis: no ?Relief with OTC cold/cough medications: no  ?Treatments attempted: cold/sinus, mucinex, and anti-histamine  ? ?DEPRESSION ?Mood status: better ?Satisfied with current treatment?: yes ?Symptom severity: mild  ?Duration of current treatment : chronic ?Side effects: no ?Medication compliance: excellent compliance ?Psychotherapy/counseling: no  ?Previous psychiatric medications: effexor, seroquel, hydroxyzine ?Depressed mood: no ?Anxious mood: no ?Anhedonia: no ?Significant weight loss or gain: no ?Insomnia: no  ?Fatigue: yes ?Feelings of worthlessness or guilt: no ?Impaired concentration/indecisiveness: no ?Suicidal ideations: no ?Hopelessness: no ?Crying spells: no ? ?  05/30/2021  ?  9:42 AM 06/21/2020  ?  2:58 PM 03/28/2020  ?  4:07 PM 12/21/2019  ?  2:24 PM 11/08/2019  ? 11:26 AM  ?Depression screen PHQ 2/9  ?Decreased Interest 2 0 0 0 0  ?Down, Depressed, Hopeless 2 0 0 0 0  ?PHQ - 2 Score 4 0 0 0  0  ?Altered sleeping 1 0 0 0 0  ?Tired, decreased energy 1 0 0 0 0  ?Change in appetite 1 0 0 0 2  ?Feeling bad or failure about yourself  1 0 0 0 0  ?Trouble concentrating 3 0 0 0 1  ?Moving slowly or fidgety/restless 2 0 0 0 0  ?Suicidal thoughts 0 0 0 0 0  ?PHQ-9 Score 13 0 0 0 3  ?Difficult doing work/chores   Not difficult at all Not difficult at all Very difficult  ? ? ?  05/30/2021  ?  9:41 AM 03/28/2020  ?  4:08 PM 12/21/2019  ?  2:25 PM 11/08/2019  ? 11:27 AM  ?GAD 7 : Generalized Anxiety Score  ?Nervous, Anxious, on Edge 3 1 0 3  ?Control/stop worrying 3 0 0 0  ?Worry too much - different things 3 0 0 2  ?Trouble relaxing 3 0 0 2  ?Restless 3 0 0 0  ?Easily annoyed or irritable 3 0 0 3  ?Afraid - awful might happen 0 0 0 0  ?Total GAD 7 Score 18 1 0 10  ?Anxiety Difficulty  Not difficult at all Not difficult at all Very difficult  ? ? ? ?Relevant past medical, surgical, family and social history reviewed and updated as indicated. Interim medical history since our last visit reviewed. ?Allergies and medications reviewed and updated. ? ?Review of Systems  ?Constitutional:  Positive for fatigue and fever. Negative for activity change, appetite change, chills, diaphoresis and  unexpected weight change.  ?HENT:  Positive for congestion, postnasal drip, rhinorrhea, sinus pressure and sinus pain. Negative for dental problem, drooling, ear discharge, ear pain, facial swelling, hearing loss, mouth sores, nosebleeds, sneezing, sore throat, tinnitus, trouble swallowing and voice change.   ?Eyes: Negative.   ?Respiratory: Negative.    ?Cardiovascular: Negative.   ?Gastrointestinal: Negative.   ?Musculoskeletal: Negative.   ?Psychiatric/Behavioral: Negative.    ? ?Per HPI unless specifically indicated above ? ?   ?Objective:  ?  ?BP 111/77   Pulse 97   Temp 97.9 ?F (36.6 ?C)   Wt 139 lb (63 kg)   LMP 02/25/2017   SpO2 100%   BMI 20.53 kg/m?   ?Wt Readings from Last 3 Encounters:  ?11/28/21 139 lb (63 kg)  ?05/30/21  129 lb (58.5 kg)  ?01/18/21 127 lb 6.4 oz (57.8 kg)  ?  ?Physical Exam ?Vitals and nursing note reviewed.  ?Constitutional:   ?   General: She is not in acute distress. ?   Appearance: Normal appearance. She is normal weight. She is not ill-appearing, toxic-appearing or diaphoretic.  ?HENT:  ?   Head: Normocephalic and atraumatic.  ?   Right Ear: Tympanic membrane, ear canal and external ear normal. There is no impacted cerumen.  ?   Left Ear: Tympanic membrane, ear canal and external ear normal. There is no impacted cerumen.  ?   Nose: Congestion and rhinorrhea present.  ?   Mouth/Throat:  ?   Mouth: Mucous membranes are moist.  ?   Pharynx: Oropharynx is clear. No oropharyngeal exudate or posterior oropharyngeal erythema.  ?Eyes:  ?   General: No scleral icterus.    ?   Right eye: No discharge.     ?   Left eye: No discharge.  ?   Extraocular Movements: Extraocular movements intact.  ?   Conjunctiva/sclera: Conjunctivae normal.  ?   Pupils: Pupils are equal, round, and reactive to light.  ?Cardiovascular:  ?   Rate and Rhythm: Normal rate and regular rhythm.  ?   Pulses: Normal pulses.  ?   Heart sounds: Normal heart sounds. No murmur heard. ?  No friction rub. No gallop.  ?Pulmonary:  ?   Effort: Pulmonary effort is normal. No respiratory distress.  ?   Breath sounds: Normal breath sounds. No stridor. No wheezing, rhonchi or rales.  ?Chest:  ?   Chest wall: No tenderness.  ?Musculoskeletal:     ?   General: Normal range of motion.  ?   Cervical back: Normal range of motion and neck supple.  ?Skin: ?   General: Skin is warm and dry.  ?   Capillary Refill: Capillary refill takes less than 2 seconds.  ?   Coloration: Skin is not jaundiced or pale.  ?   Findings: No bruising, erythema, lesion or rash.  ?Neurological:  ?   General: No focal deficit present.  ?   Mental Status: She is alert and oriented to person, place, and time. Mental status is at baseline.  ?Psychiatric:     ?   Mood and Affect: Mood normal.     ?    Behavior: Behavior normal.     ?   Thought Content: Thought content normal.     ?   Judgment: Judgment normal.  ? ? ?Results for orders placed or performed in visit on 05/30/21  ?GC/Chlamydia Probe Amp  ? Specimen: Urine  ? UR  ?Result Value Ref Range  ? Chlamydia trachomatis, NAA  Negative Negative  ? Neisseria Gonorrhoeae by PCR Negative Negative  ?Microscopic Examination  ? Urine  ?Result Value Ref Range  ? WBC, UA None seen 0 - 5 /hpf  ? RBC None seen 0 - 2 /hpf  ? Epithelial Cells (non renal) 0-10 0 - 10 /hpf  ? Mucus, UA Present (A) Not Estab.  ? Bacteria, UA Few (A) None seen/Few  ?CBC with Differential/Platelet  ?Result Value Ref Range  ? WBC 4.3 3.4 - 10.8 x10E3/uL  ? RBC 4.18 3.77 - 5.28 x10E6/uL  ? Hemoglobin 12.1 11.1 - 15.9 g/dL  ? Hematocrit 37.8 34.0 - 46.6 %  ? MCV 90 79 - 97 fL  ? MCH 28.9 26.6 - 33.0 pg  ? MCHC 32.0 31.5 - 35.7 g/dL  ? RDW 12.2 11.7 - 15.4 %  ? Platelets 301 150 - 450 x10E3/uL  ? Neutrophils 52 Not Estab. %  ? Lymphs 37 Not Estab. %  ? Monocytes 9 Not Estab. %  ? Eos 1 Not Estab. %  ? Basos 1 Not Estab. %  ? Neutrophils Absolute 2.3 1.4 - 7.0 x10E3/uL  ? Lymphocytes Absolute 1.6 0.7 - 3.1 x10E3/uL  ? Monocytes Absolute 0.4 0.1 - 0.9 x10E3/uL  ? EOS (ABSOLUTE) 0.0 0.0 - 0.4 x10E3/uL  ? Basophils Absolute 0.0 0.0 - 0.2 x10E3/uL  ? Immature Granulocytes 0 Not Estab. %  ? Immature Grans (Abs) 0.0 0.0 - 0.1 x10E3/uL  ?Comprehensive metabolic panel  ?Result Value Ref Range  ? Glucose 81 70 - 99 mg/dL  ? BUN 8 6 - 20 mg/dL  ? Creatinine, Ser 0.71 0.57 - 1.00 mg/dL  ? eGFR 114 >59 mL/min/1.73  ? BUN/Creatinine Ratio 11 9 - 23  ? Sodium 141 134 - 144 mmol/L  ? Potassium 4.2 3.5 - 5.2 mmol/L  ? Chloride 104 96 - 106 mmol/L  ? CO2 22 20 - 29 mmol/L  ? Calcium 9.5 8.7 - 10.2 mg/dL  ? Total Protein 7.9 6.0 - 8.5 g/dL  ? Albumin 5.0 (H) 3.8 - 4.8 g/dL  ? Globulin, Total 2.9 1.5 - 4.5 g/dL  ? Albumin/Globulin Ratio 1.7 1.2 - 2.2  ? Bilirubin Total 0.7 0.0 - 1.2 mg/dL  ? Alkaline Phosphatase 65 44  - 121 IU/L  ? AST 23 0 - 40 IU/L  ? ALT 18 0 - 32 IU/L  ?Lipid Panel w/o Chol/HDL Ratio  ?Result Value Ref Range  ? Cholesterol, Total 183 100 - 199 mg/dL  ? Triglycerides 50 0 - 149 mg/dL  ? HDL 68 >39 mg/dL

## 2021-11-29 ENCOUNTER — Ambulatory Visit: Payer: 59 | Admitting: Family Medicine

## 2021-11-29 ENCOUNTER — Encounter: Payer: Self-pay | Admitting: Family Medicine

## 2021-11-29 VITALS — BP 107/76 | HR 73 | Temp 98.4°F | Wt 139.0 lb

## 2021-11-29 DIAGNOSIS — M545 Low back pain, unspecified: Secondary | ICD-10-CM | POA: Diagnosis not present

## 2021-11-29 DIAGNOSIS — M9909 Segmental and somatic dysfunction of abdomen and other regions: Secondary | ICD-10-CM | POA: Diagnosis not present

## 2021-11-29 DIAGNOSIS — M9905 Segmental and somatic dysfunction of pelvic region: Secondary | ICD-10-CM | POA: Diagnosis not present

## 2021-11-29 DIAGNOSIS — M9904 Segmental and somatic dysfunction of sacral region: Secondary | ICD-10-CM | POA: Diagnosis not present

## 2021-11-29 DIAGNOSIS — M9908 Segmental and somatic dysfunction of rib cage: Secondary | ICD-10-CM | POA: Diagnosis not present

## 2021-11-29 DIAGNOSIS — M9903 Segmental and somatic dysfunction of lumbar region: Secondary | ICD-10-CM

## 2021-11-29 NOTE — Progress Notes (Signed)
? ?BP 107/76   Pulse 73   Temp 98.4 ?F (36.9 ?C)   Wt 139 lb (63 kg)   LMP 02/25/2017   SpO2 98%   BMI 20.53 kg/m?   ? ?Subjective:  ? ? Patient ID: Courtney Rivers, female    DOB: 07-Jul-1986, 36 y.o.   MRN: [id number] ? ?HPI: ?Courtney Rivers is a 36 y.o. female ? ?Chief Complaint  ?Patient presents with  ? Back Pain  ?   ?  ? ?BACK PAIN ?Duration: weeks ?Mechanism of injury: unknown ?Location: bilateral and low back ?Onset: gradual ?Severity: moderate ?Quality: aching and sore ?Frequency: intermittent ?Radiation: none ?Aggravating factors: lifting, sitting for a long period ?Alleviating factors: OMT, stretching ?Status: worse ?Treatments attempted: rest, ice, heat, APAP, ibuprofen, aleve, and HEP  ?Relief with NSAIDs?: mild ?Nighttime pain:  yes ?Paresthesias / decreased sensation:  no ?Bowel / bladder incontinence:  no ?Fevers:  no ?Dysuria / urinary frequency:  no ? ? ?Relevant past medical, surgical, family and social history reviewed and updated as indicated. Interim medical history since our last visit reviewed. ?Allergies and medications reviewed and updated. ? ?Review of Systems  ?Constitutional: Negative.   ?Respiratory: Negative.    ?Cardiovascular: Negative.   ?Musculoskeletal:  Positive for back pain and myalgias. Negative for arthralgias, gait problem, joint swelling, neck pain and neck stiffness.  ?Skin: Negative.   ?Neurological: Negative.   ?Psychiatric/Behavioral: Negative.    ? ?Per HPI unless specifically indicated above ? ?   ?Objective:  ?  ?BP 107/76   Pulse 73   Temp 98.4 ?F (36.9 ?C)   Wt 139 lb (63 kg)   LMP 02/25/2017   SpO2 98%   BMI 20.53 kg/m?   ?Wt Readings from Last 3 Encounters:  ?11/29/21 139 lb (63 kg)  ?11/28/21 139 lb (63 kg)  ?05/30/21 129 lb (58.5 kg)  ?  ?Physical Exam ?Vitals and nursing note reviewed.  ?Constitutional:   ?   General: She is not in acute distress. ?   Appearance: Normal appearance. She is not ill-appearing.  ?HENT:  ?   Head:  Normocephalic and atraumatic.  ?   Right Ear: External ear normal.  ?   Left Ear: External ear normal.  ?   Nose: Nose normal.  ?   Mouth/Throat:  ?   Mouth: Mucous membranes are moist.  ?   Pharynx: Oropharynx is clear.  ?Eyes:  ?   Extraocular Movements: Extraocular movements intact.  ?   Conjunctiva/sclera: Conjunctivae normal.  ?   Pupils: Pupils are equal, round, and reactive to light.  ?Neck:  ?   Vascular: No carotid bruit.  ?Cardiovascular:  ?   Rate and Rhythm: Normal rate.  ?   Pulses: Normal pulses.  ?Pulmonary:  ?   Effort: Pulmonary effort is normal. No respiratory distress.  ?Abdominal:  ?   General: Abdomen is flat. There is no distension.  ?   Palpations: Abdomen is soft. There is no mass.  ?   Tenderness: There is no abdominal tenderness. There is no right CVA tenderness, left CVA tenderness, guarding or rebound.  ?   Hernia: No hernia is present.  ?Musculoskeletal:  ?   Cervical back: No muscular tenderness.  ?Lymphadenopathy:  ?   Cervical: No cervical adenopathy.  ?Skin: ?   General: Skin is warm and dry.  ?   Capillary Refill: Capillary refill takes less than 2 seconds.  ?   Coloration: Skin is not jaundiced or pale.  ?  Findings: No bruising, erythema, lesion or rash.  ?Neurological:  ?   General: No focal deficit present.  ?   Mental Status: She is alert. Mental status is at baseline.  ?Psychiatric:     ?   Mood and Affect: Mood normal.     ?   Behavior: Behavior normal.     ?   Thought Content: Thought content normal.     ?   Judgment: Judgment normal.  ? ?Musculoskeletal:  Exam found Decreased ROM, Tissue texture changes, Tenderness to palpation, and Asymmetry of patient's  ribs, lumbar, pelvis, sacrum, and abdomen ?Osteopathic Structural Exam:  ? Ribs: Rib 5 locked up on the R, Ribs 5-8 locked up on the L  ? Lumbar: L1ESRR, L3-5SLRR ? Pelvis: Posterior L innominate ? Sacrum: R on R torsion ? Abdomen: diaphragm hypertonic R>L ? ?Results for orders placed or performed in visit on 05/30/21   ?GC/Chlamydia Probe Amp  ? Specimen: Urine  ? UR  ?Result Value Ref Range  ? Chlamydia trachomatis, NAA Negative Negative  ? Neisseria Gonorrhoeae by PCR Negative Negative  ?Microscopic Examination  ? Urine  ?Result Value Ref Range  ? WBC, UA None seen 0 - 5 /hpf  ? RBC None seen 0 - 2 /hpf  ? Epithelial Cells (non renal) 0-10 0 - 10 /hpf  ? Mucus, UA Present (A) Not Estab.  ? Bacteria, UA Few (A) None seen/Few  ?CBC with Differential/Platelet  ?Result Value Ref Range  ? WBC 4.3 3.4 - 10.8 x10E3/uL  ? RBC 4.18 3.77 - 5.28 x10E6/uL  ? Hemoglobin 12.1 11.1 - 15.9 g/dL  ? Hematocrit 37.8 34.0 - 46.6 %  ? MCV 90 79 - 97 fL  ? MCH 28.9 26.6 - 33.0 pg  ? MCHC 32.0 31.5 - 35.7 g/dL  ? RDW 12.2 11.7 - 15.4 %  ? Platelets 301 150 - 450 x10E3/uL  ? Neutrophils 52 Not Estab. %  ? Lymphs 37 Not Estab. %  ? Monocytes 9 Not Estab. %  ? Eos 1 Not Estab. %  ? Basos 1 Not Estab. %  ? Neutrophils Absolute 2.3 1.4 - 7.0 x10E3/uL  ? Lymphocytes Absolute 1.6 0.7 - 3.1 x10E3/uL  ? Monocytes Absolute 0.4 0.1 - 0.9 x10E3/uL  ? EOS (ABSOLUTE) 0.0 0.0 - 0.4 x10E3/uL  ? Basophils Absolute 0.0 0.0 - 0.2 x10E3/uL  ? Immature Granulocytes 0 Not Estab. %  ? Immature Grans (Abs) 0.0 0.0 - 0.1 x10E3/uL  ?Comprehensive metabolic panel  ?Result Value Ref Range  ? Glucose 81 70 - 99 mg/dL  ? BUN 8 6 - 20 mg/dL  ? Creatinine, Ser 0.71 0.57 - 1.00 mg/dL  ? eGFR 114 >59 mL/min/1.73  ? BUN/Creatinine Ratio 11 9 - 23  ? Sodium 141 134 - 144 mmol/L  ? Potassium 4.2 3.5 - 5.2 mmol/L  ? Chloride 104 96 - 106 mmol/L  ? CO2 22 20 - 29 mmol/L  ? Calcium 9.5 8.7 - 10.2 mg/dL  ? Total Protein 7.9 6.0 - 8.5 g/dL  ? Albumin 5.0 (H) 3.8 - 4.8 g/dL  ? Globulin, Total 2.9 1.5 - 4.5 g/dL  ? Albumin/Globulin Ratio 1.7 1.2 - 2.2  ? Bilirubin Total 0.7 0.0 - 1.2 mg/dL  ? Alkaline Phosphatase 65 44 - 121 IU/L  ? AST 23 0 - 40 IU/L  ? ALT 18 0 - 32 IU/L  ?Lipid Panel w/o Chol/HDL Ratio  ?Result Value Ref Range  ? Cholesterol, Total 183 100 - 199 mg/dL  ?  Triglycerides 50 0  - 149 mg/dL  ? HDL 68 >39 mg/dL  ? VLDL Cholesterol Cal 10 5 - 40 mg/dL  ? LDL Chol Calc (NIH) 105 (H) 0 - 99 mg/dL  ?Urinalysis, Routine w reflex microscopic  ?Result Value Ref Range  ? Specific Gravity, UA 1.025 1.005 - 1.030  ? pH, UA 7.0 5.0 - 7.5  ? Color, UA Yellow Yellow  ? Appearance Ur Cloudy (A) Clear  ? Leukocytes,UA Negative Negative  ? Protein,UA 2+ (A) Negative/Trace  ? Glucose, UA Negative Negative  ? Ketones, UA Negative Negative  ? RBC, UA Negative Negative  ? Bilirubin, UA Negative Negative  ? Urobilinogen, Ur 1.0 0.2 - 1.0 mg/dL  ? Nitrite, UA Negative Negative  ? Microscopic Examination See below:   ?TSH  ?Result Value Ref Range  ? TSH 1.130 0.450 - 4.500 uIU/mL  ?HIV Antibody (routine testing w rflx)  ?Result Value Ref Range  ? HIV Screen 4th Generation wRfx Non Reactive Non Reactive  ?RPR  ?Result Value Ref Range  ? RPR Ser Ql Non Reactive Non Reactive  ?Acute Viral Hepatitis (HAV, HBV, HCV)  ?Result Value Ref Range  ? Hep A IgM Negative Negative  ? Hepatitis B Surface Ag Negative Negative  ? Hep B C IgM Negative Negative  ? HCV Ab <0.1 0.0 - 0.9 s/co ratio  ?Interpretation:  ?Result Value Ref Range  ? HCV Interp 1: Comment   ? ?   ?Assessment & Plan:  ? ?Problem List Items Addressed This Visit   ?None ?Visit Diagnoses   ? ? Acute bilateral low back pain without sciatica    -  Primary  ? In acute exacerbation. She does have somatic dysfunction that is contributing to her symptoms. Treated today with good results as below. Call with concerns.   ? Sacral region somatic dysfunction      ? Lumbar region somatic dysfunction      ? Rib cage region somatic dysfunction      ? Pelvic somatic dysfunction      ? Segmental dysfunction of abdomen      ? ?  ?After verbal consent was obtained, patient was treated today with osteopathic manipulative medicine to the regions of the ribs, lumbar, pelvis, sacrum, and abdomen using the techniques of myofascial release, counterstrain, muscle energy, HVLA, and soft  tissue. Areas of compensation relating to her primary pain source also treated. Patient tolerated the procedure well with good objective and good subjective improvement in symptoms. She left the room in good condition. She was

## 2022-01-01 ENCOUNTER — Ambulatory Visit: Payer: 59 | Admitting: Family Medicine

## 2022-01-01 ENCOUNTER — Encounter: Payer: Self-pay | Admitting: Family Medicine

## 2022-01-01 VITALS — BP 98/69 | HR 72 | Temp 98.6°F | Ht 68.5 in | Wt 138.4 lb

## 2022-01-01 DIAGNOSIS — F3342 Major depressive disorder, recurrent, in full remission: Secondary | ICD-10-CM

## 2022-01-01 DIAGNOSIS — M25561 Pain in right knee: Secondary | ICD-10-CM

## 2022-01-01 DIAGNOSIS — Z0000 Encounter for general adult medical examination without abnormal findings: Secondary | ICD-10-CM

## 2022-01-01 DIAGNOSIS — G8929 Other chronic pain: Secondary | ICD-10-CM | POA: Diagnosis not present

## 2022-01-01 LAB — URINALYSIS, ROUTINE W REFLEX MICROSCOPIC
Bilirubin, UA: NEGATIVE
Glucose, UA: NEGATIVE
Ketones, UA: NEGATIVE
Leukocytes,UA: NEGATIVE
Nitrite, UA: NEGATIVE
Protein,UA: NEGATIVE
RBC, UA: NEGATIVE
Specific Gravity, UA: 1.015 (ref 1.005–1.030)
Urobilinogen, Ur: 0.2 mg/dL (ref 0.2–1.0)
pH, UA: 7.5 (ref 5.0–7.5)
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
## 2022-01-01 NOTE — Assessment & Plan Note (Signed)
Doing well on current regimen. Continue to monitor. Call with any concerns. Refills up to date.

## 2022-01-02 LAB — CBC WITH DIFFERENTIAL/PLATELET
Basophils Absolute: 0 10*3/uL (ref 0.0–0.2)
Basos: 1 %
EOS (ABSOLUTE): 0.1 10*3/uL (ref 0.0–0.4)
Eos: 3 %
Hematocrit: 40 % (ref 34.0–46.6)
Hemoglobin: 12.7 g/dL (ref 11.1–15.9)
Immature Grans (Abs): 0 10*3/uL (ref 0.0–0.1)
Immature Granulocytes: 0 %
Lymphocytes Absolute: 1.7 10*3/uL (ref 0.7–3.1)
Lymphs: 47 %
MCH: 28.5 pg (ref 26.6–33.0)
MCHC: 31.8 g/dL (ref 31.5–35.7)
MCV: 90 fL (ref 79–97)
Monocytes Absolute: 0.4 10*3/uL (ref 0.1–0.9)
Monocytes: 10 %
Neutrophils Absolute: 1.4 10*3/uL (ref 1.4–7.0)
Neutrophils: 39 %
Platelets: 275 10*3/uL (ref 150–450)
RBC: 4.46 x10E6/uL (ref 3.77–5.28)
RDW: 12.4 % (ref 11.7–15.4)
WBC: 3.7 10*3/uL (ref 3.4–10.8)

## 2022-01-02 LAB — COMPREHENSIVE METABOLIC PANEL
ALT: 15 IU/L (ref 0–32)
AST: 17 IU/L (ref 0–40)
Albumin/Globulin Ratio: 1.9 (ref 1.2–2.2)
Albumin: 4.5 g/dL (ref 3.8–4.8)
Alkaline Phosphatase: 56 IU/L (ref 44–121)
BUN/Creatinine Ratio: 7 — ABNORMAL LOW (ref 9–23)
BUN: 5 mg/dL — ABNORMAL LOW (ref 6–20)
Bilirubin Total: 0.2 mg/dL (ref 0.0–1.2)
CO2: 25 mmol/L (ref 20–29)
Calcium: 9.3 mg/dL (ref 8.7–10.2)
Chloride: 103 mmol/L (ref 96–106)
Creatinine, Ser: 0.76 mg/dL (ref 0.57–1.00)
Globulin, Total: 2.4 g/dL (ref 1.5–4.5)
Glucose: 77 mg/dL (ref 70–99)
Potassium: 3.9 mmol/L (ref 3.5–5.2)
Sodium: 140 mmol/L (ref 134–144)
Total Protein: 6.9 g/dL (ref 6.0–8.5)
eGFR: 105 mL/min/{1.73_m2} (ref >59)

## 2022-01-02 LAB — TSH: TSH: 0.907 u[IU]/mL (ref 0.450–4.500)

## 2022-01-02 LAB — LIPID PANEL W/O CHOL/HDL RATIO
Cholesterol, Total: 187 mg/dL (ref 100–199)
HDL: 56 mg/dL (ref >39)
LDL Chol Calc (NIH): 114 mg/dL — ABNORMAL HIGH (ref 0–99)
Triglycerides: 91 mg/dL (ref 0–149)
VLDL Cholesterol Cal: 17 mg/dL (ref 5–40)

## 2022-02-07 ENCOUNTER — Ambulatory Visit: Payer: 59 | Admitting: Family Medicine

## 2022-03-03 ENCOUNTER — Telehealth (INDEPENDENT_AMBULATORY_CARE_PROVIDER_SITE_OTHER): Payer: 59 | Admitting: Nurse Practitioner

## 2022-03-03 ENCOUNTER — Encounter: Payer: Self-pay | Admitting: Nurse Practitioner

## 2022-03-03 DIAGNOSIS — M542 Cervicalgia: Secondary | ICD-10-CM | POA: Diagnosis not present

## 2022-03-03 DIAGNOSIS — R0981 Nasal congestion: Secondary | ICD-10-CM

## 2022-03-03 DIAGNOSIS — R6889 Other general symptoms and signs: Secondary | ICD-10-CM

## 2022-03-03 LAB — VERITOR FLU A/B WAIVED
Influenza A: NEGATIVE
Influenza B: NEGATIVE

## 2022-03-03 NOTE — Progress Notes (Signed)
Results discussed with patient during visit.

## 2022-03-03 NOTE — Progress Notes (Signed)
LMP 01/26/2017 (Exact Date)    Subjective:    Patient ID: Courtney Rivers, female    DOB: 1986/08/10, 36 y.o.   MRN: [id number]  HPI: Courtney Rivers is a 36 y.o. female  Chief Complaint  Patient presents with   Generalized Body Aches    Patient states she woke up with L sided neck swelling/pain, L nostril congestion. Took allergy pills which helped per patient. Patient states bodyaches onset Saturday. Symptoms worsened that Saturday night per patient. Denies N/V/D.   UPPER RESPIRATORY TRACT INFECTION Worst symptom: Patient states she woke up with L sided neck swelling/pain, L nostril congestion. Took allergy pills which helped per patient. Patient states bodyaches onset Saturday. Symptoms worsened that Saturday night per patient. Denies N/V/D. Fever:  unsure Cough: yes Shortness of breath: no Wheezing: no Chest pain: no Chest tightness: no Chest congestion: no Nasal congestion: yes Runny nose: no Post nasal drip: no Sneezing: no Sore throat: yes Swollen glands: yes Sinus pressure: yes Headache: yes Face pain: no Toothache: no Ear pain: yes bilateral Ear pressure: yes bilateral Eyes red/itching:no Eye drainage/crusting: no  Vomiting: no Rash: no Fatigue: yes Sick contacts: no Strep contacts: no  Context: stable Recurrent sinusitis: no Relief with OTC cold/cough medications: no  Treatments attempted:  ibuprofen    Relevant past medical, surgical, family and social history reviewed and updated as indicated. Interim medical history since our last visit reviewed. Allergies and medications reviewed and updated.  Review of Systems  Constitutional:  Positive for fatigue. Negative for fever.  HENT:  Positive for congestion, ear pain, rhinorrhea, sinus pressure and sore throat. Negative for dental problem, postnasal drip, sinus pain and sneezing.   Respiratory:  Positive for cough. Negative for shortness of breath and wheezing.   Cardiovascular:  Negative for  chest pain.  Gastrointestinal:  Negative for vomiting.  Skin:  Negative for rash.  Neurological:  Positive for headaches.    Per HPI unless specifically indicated above     Objective:    LMP 01/26/2017 (Exact Date)   Wt Readings from Last 3 Encounters:  01/01/22 138 lb 6.4 oz (62.8 kg)  11/29/21 139 lb (63 kg)  11/28/21 139 lb (63 kg)    Physical Exam Vitals and nursing note reviewed.  HENT:     Head: Normocephalic.     Right Ear: Hearing normal.     Left Ear: Hearing normal.     Nose: Nose normal.  Eyes:     Pupils: Pupils are equal, round, and reactive to light.  Pulmonary:     Effort: Pulmonary effort is normal. No respiratory distress.  Neurological:     Mental Status: She is alert.  Psychiatric:        Mood and Affect: Mood normal.        Behavior: Behavior normal.        Thought Content: Thought content normal.        Judgment: Judgment normal.     Results for orders placed or performed in visit on 03/03/22  Influenza A & B (STAT)  Result Value Ref Range   Influenza A Negative Negative   Influenza B Negative Negative      Assessment & Plan:   Problem List Items Addressed This Visit   None Visit Diagnoses     Flu-like symptoms    -  Primary   Flu neg in office. Will await COVID test to determine treatment. Will follow up with patient once results are in. Discussed s/s to  seek higher level of care.   Relevant Orders   Influenza A & B (STAT) (Completed)        Follow up plan: Return if symptoms worsen or fail to improve.   This visit was completed via MyChart due to the restrictions of the COVID-19 pandemic. All issues as above were discussed and addressed. Physical exam was done as above through visual confirmation on MyChart. If it was felt that the patient should be evaluated in the office, they were directed there. The patient verbally consented to this visit. Location of the patient: Home Location of the provider: Office Those involved with  this call:  Provider: Larae Grooms, NP CMA: Anitra Lauth, CMA Front Desk/Registration: Servando Snare This encounter was conducted via video.  I spent 20 dedicated to the care of this patient on the date of this encounter to include previsit review of symptoms, plan of care, neg Flu and follow up, face to face time with the patient, and post visit ordering of testing.

## 2022-03-04 ENCOUNTER — Telehealth: Payer: Self-pay | Admitting: Family Medicine

## 2022-03-04 ENCOUNTER — Telehealth: Payer: 59 | Admitting: Family Medicine

## 2022-03-04 MED ORDER — MOLNUPIRAVIR EUA 200MG CAPSULE: 4.0000 | ORAL_CAPSULE | Freq: Two times a day (BID) | ORAL | 0 refills | Status: AC

## 2022-03-04 NOTE — Telephone Encounter (Signed)
Clydie Braun advised pt to call in / Pt stated her covid result came back positive /pt is not sure if Clydie Braun wats to call in medication or advise her what to do / pt has been sick since Saturday / please advise

## 2022-03-04 NOTE — Telephone Encounter (Signed)
Patient was seen by Clydie Braun 03/03/2022

## 2022-03-04 NOTE — Telephone Encounter (Signed)
Please schedule virtual visit with provider.

## 2022-03-04 NOTE — Telephone Encounter (Signed)
Patient states she uses [facility] [address]

## 2022-03-04 NOTE — Telephone Encounter (Signed)
Yes- OK to double book virtual at any time today and I'll get her meds

## 2022-03-04 NOTE — Telephone Encounter (Signed)
Patient notified by phone.

## 2022-03-04 NOTE — Telephone Encounter (Signed)
Pt just wanted to know if she should get anything prescribed with her having symptoms since Saturday. She said she has been fine with ibuprofen and nyquil cold and flu.

## 2022-03-19 ENCOUNTER — Ambulatory Visit (INDEPENDENT_AMBULATORY_CARE_PROVIDER_SITE_OTHER): Payer: 59 | Admitting: Podiatry

## 2022-03-19 DIAGNOSIS — M7751 Other enthesopathy of right foot: Secondary | ICD-10-CM
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
## 2022-04-01 ENCOUNTER — Ambulatory Visit: Payer: 59 | Admitting: Family Medicine

## 2022-04-01 ENCOUNTER — Ambulatory Visit: Payer: 59 | Admitting: Nurse Practitioner

## 2022-04-01 ENCOUNTER — Encounter: Payer: Self-pay | Admitting: Family Medicine

## 2022-04-01 VITALS — BP 112/73 | HR 80 | Temp 98.6°F | Wt 130.3 lb

## 2022-04-01 DIAGNOSIS — M25561 Pain in right knee: Secondary | ICD-10-CM

## 2022-04-01 MED ORDER — NAPROXEN 500 MG PO TABS: 500.0000 mg | ORAL_TABLET | Freq: Two times a day (BID) | ORAL | 0 refills | Status: DC

## 2022-04-01 NOTE — Progress Notes (Signed)
BP 112/73   Pulse 80   Temp 98.6 F (37 C) (Oral)   Wt 130 lb 4.8 oz (59.1 kg)   LMP 01/26/2017 (Exact Date)   SpO2 99%   BMI 19.52 kg/m    Subjective:    Patient ID: Courtney Rivers, female    DOB: Jun 29, 1986, 36 y.o.   MRN: [id number]  HPI: Courtney Rivers is a 36 y.o. female  Chief Complaint  Patient presents with   Knee Pain    Pt states she felt a pop in her R knee yesterday.    KNEE PAIN Duration: 1 day Involved knee: R knee Mechanism of injury: standing up Location:diffuse Onset: sudden Severity: severe  Quality:  aching and sore Frequency: constant Radiation: no Aggravating factors: bending and moving  Alleviating factors: nothing  Status: stable Treatments attempted: rest and ibuprofen  Relief with NSAIDs?:  mild Weakness with weight bearing or walking: no Sensation of giving way: no Locking: yes Popping: yes Bruising: no Swelling: yes Redness: no Paresthesias/decreased sensation: no Fevers: no   Relevant past medical, surgical, family and social history reviewed and updated as indicated. Interim medical history since our last visit reviewed. Allergies and medications reviewed and updated.  Review of Systems  Constitutional: Negative.   Respiratory: Negative.    Cardiovascular: Negative.   Gastrointestinal: Negative.   Genitourinary: Negative.   Musculoskeletal:  Positive for arthralgias. Negative for back pain, gait problem, joint swelling, myalgias, neck pain and neck stiffness.  Skin: Negative.   Neurological: Negative.   Psychiatric/Behavioral: Negative.      Per HPI unless specifically indicated above     Objective:    BP 112/73   Pulse 80   Temp 98.6 F (37 C) (Oral)   Wt 130 lb 4.8 oz (59.1 kg)   LMP 01/26/2017 (Exact Date)   SpO2 99%   BMI 19.52 kg/m   Wt Readings from Last 3 Encounters:  04/01/22 130 lb 4.8 oz (59.1 kg)  01/01/22 138 lb 6.4 oz (62.8 kg)  11/29/21 139 lb (63 kg)    Physical Exam Vitals  and nursing note reviewed.  Constitutional:      General: She is not in acute distress.    Appearance: Normal appearance. She is not ill-appearing, toxic-appearing or diaphoretic.  HENT:     Head: Normocephalic and atraumatic.     Right Ear: External ear normal.     Left Ear: External ear normal.     Nose: Nose normal.     Mouth/Throat:     Mouth: Mucous membranes are moist.     Pharynx: Oropharynx is clear.  Eyes:     General: No scleral icterus.       Right eye: No discharge.        Left eye: No discharge.     Extraocular Movements: Extraocular movements intact.     Conjunctiva/sclera: Conjunctivae normal.     Pupils: Pupils are equal, round, and reactive to light.  Cardiovascular:     Rate and Rhythm: Normal rate and regular rhythm.     Pulses: Normal pulses.     Heart sounds: Normal heart sounds. No murmur heard.    No friction rub. No gallop.  Pulmonary:     Effort: Pulmonary effort is normal. No respiratory distress.     Breath sounds: Normal breath sounds. No stridor. No wheezing, rhonchi or rales.  Chest:     Chest wall: No tenderness.  Musculoskeletal:        General: Swelling present.  No tenderness, deformity or signs of injury. Normal range of motion.     Cervical back: Normal range of motion and neck supple.     Right lower leg: No edema.     Left lower leg: No edema.     Comments: Negative anterior and posterior drawer, negative varus and valgus, + mild effusion  Skin:    General: Skin is warm and dry.     Capillary Refill: Capillary refill takes less than 2 seconds.     Coloration: Skin is not jaundiced or pale.     Findings: No bruising, erythema, lesion or rash.  Neurological:     General: No focal deficit present.     Mental Status: She is alert and oriented to person, place, and time. Mental status is at baseline.  Psychiatric:        Mood and Affect: Mood normal.        Behavior: Behavior normal.        Thought Content: Thought content normal.         Judgment: Judgment normal.     Results for orders placed or performed in visit on 03/03/22  Influenza A & B (STAT)  Result Value Ref Range   Influenza A Negative Negative   Influenza B Negative Negative      Assessment & Plan:   Problem List Items Addressed This Visit   None Visit Diagnoses     Acute pain of right knee    -  Primary   Light duty at work. Naproxen and rest. Call if not better in 1 week. Continue to monitor.         Follow up plan: Return if symptoms worsen or fail to improve.

## 2022-04-11 ENCOUNTER — Ambulatory Visit: Payer: 59 | Admitting: Family Medicine

## 2022-04-11 ENCOUNTER — Encounter: Payer: Self-pay | Admitting: Family Medicine

## 2022-04-11 VITALS — BP 97/67 | HR 73 | Temp 98.0°F | Wt 132.9 lb

## 2022-04-11 DIAGNOSIS — M25561 Pain in right knee: Secondary | ICD-10-CM

## 2022-04-11 DIAGNOSIS — F331 Major depressive disorder, recurrent, moderate: Secondary | ICD-10-CM | POA: Diagnosis not present

## 2022-04-11 DIAGNOSIS — F411 Generalized anxiety disorder: Secondary | ICD-10-CM | POA: Diagnosis not present

## 2022-04-11 MED ORDER — VENLAFAXINE HCL ER 75 MG PO CP24: 75.0000 mg | ORAL_CAPSULE | Freq: Every day | ORAL | 3 refills | Status: DC

## 2022-04-11 MED ORDER — QUETIAPINE FUMARATE 25 MG PO TABS: 25.0000 mg | ORAL_TABLET | Freq: Every evening | ORAL | 2 refills | Status: DC | PRN

## 2022-04-11 MED ORDER — HYDROXYZINE HCL 10 MG PO TABS: 10.0000 mg | ORAL_TABLET | Freq: Three times a day (TID) | ORAL | 6 refills | Status: DC | PRN

## 2022-04-11 NOTE — Assessment & Plan Note (Signed)
Not under good control. Will add in effexor again and recheck 1 month. Call with any concerns.

## 2022-04-11 NOTE — Assessment & Plan Note (Signed)
Not under good control. Will add in effexor again and recheck 1 month. Call with any concerns.  

## 2022-04-11 NOTE — Progress Notes (Signed)
BP 97/67   Pulse 73   Temp 98 F (36.7 C)   Wt 132 lb 14.4 oz (60.3 kg)   LMP 01/26/2017 (Exact Date)   SpO2 100%   BMI 19.91 kg/m    Subjective:    Patient ID: Courtney Rivers, female    DOB: October 08, 1985, 36 y.o.   MRN: [id number]  HPI: Courtney Rivers is a 36 y.o. female  Chief Complaint  Patient presents with   Depression   Knee Pain    Patient states right knee pain is about the same    DEPRESSION Mood status: uncontrolled Satisfied with current treatment?: no Symptom severity: moderate  Duration of current treatment : chronic Side effects: no Medication compliance: excellent compliance Psychotherapy/counseling: yes  Previous psychiatric medications: seroquel Depressed mood: yes Anxious mood: yes Anhedonia: no Significant weight loss or gain: no Insomnia: no  Fatigue: yes Feelings of worthlessness or guilt: yes Impaired concentration/indecisiveness: yes Suicidal ideations: no Hopelessness: no Crying spells: no    04/11/2022    3:26 PM 01/01/2022    8:35 AM 05/30/2021    9:42 AM 06/21/2020    2:58 PM 03/28/2020    4:07 PM  Depression screen PHQ 2/9  Decreased Interest 2 0 2 0 0  Down, Depressed, Hopeless 2 0 2 0 0  PHQ - 2 Score 4 0 4 0 0  Altered sleeping 3 0 1 0 0  Tired, decreased energy 2 0 1 0 0  Change in appetite 2 0 1 0 0  Feeling bad or failure about yourself  1 0 1 0 0  Trouble concentrating 3 0 3 0 0  Moving slowly or fidgety/restless 2 0 2 0 0  Suicidal thoughts 0 0 0 0 0  PHQ-9 Score 17 0 13 0 0  Difficult doing work/chores  Not difficult at all   Not difficult at all    Knee is no better. Still hurting and swelling. Would like to see ortho.  Relevant past medical, surgical, family and social history reviewed and updated as indicated. Interim medical history since our last visit reviewed. Allergies and medications reviewed and updated.  Review of Systems  Constitutional: Negative.   Respiratory: Negative.     Cardiovascular: Negative.   Gastrointestinal: Negative.   Musculoskeletal:  Positive for arthralgias, gait problem and joint swelling. Negative for back pain, myalgias, neck pain and neck stiffness.  Skin: Negative.   Psychiatric/Behavioral: Negative.      Per HPI unless specifically indicated above     Objective:    BP 97/67   Pulse 73   Temp 98 F (36.7 C)   Wt 132 lb 14.4 oz (60.3 kg)   LMP 01/26/2017 (Exact Date)   SpO2 100%   BMI 19.91 kg/m   Wt Readings from Last 3 Encounters:  04/11/22 132 lb 14.4 oz (60.3 kg)  04/01/22 130 lb 4.8 oz (59.1 kg)  01/01/22 138 lb 6.4 oz (62.8 kg)    Physical Exam Vitals and nursing note reviewed.  Constitutional:      General: She is not in acute distress.    Appearance: Normal appearance. She is normal weight. She is not ill-appearing, toxic-appearing or diaphoretic.  HENT:     Head: Normocephalic and atraumatic.     Right Ear: External ear normal.     Left Ear: External ear normal.     Nose: Nose normal.     Mouth/Throat:     Mouth: Mucous membranes are moist.     Pharynx:  Oropharynx is clear.  Eyes:     General: No scleral icterus.       Right eye: No discharge.        Left eye: No discharge.     Extraocular Movements: Extraocular movements intact.     Conjunctiva/sclera: Conjunctivae normal.     Pupils: Pupils are equal, round, and reactive to light.  Cardiovascular:     Rate and Rhythm: Normal rate and regular rhythm.     Pulses: Normal pulses.     Heart sounds: Normal heart sounds. No murmur heard.    No friction rub. No gallop.  Pulmonary:     Effort: Pulmonary effort is normal. No respiratory distress.     Breath sounds: Normal breath sounds. No stridor. No wheezing, rhonchi or rales.  Chest:     Chest wall: No tenderness.  Musculoskeletal:        General: Swelling and tenderness present.     Cervical back: Normal range of motion and neck supple.  Skin:    General: Skin is warm and dry.     Capillary Refill:  Capillary refill takes less than 2 seconds.     Coloration: Skin is not jaundiced or pale.     Findings: No bruising, erythema, lesion or rash.  Neurological:     General: No focal deficit present.     Mental Status: She is alert and oriented to person, place, and time. Mental status is at baseline.  Psychiatric:        Mood and Affect: Mood normal.        Behavior: Behavior normal.        Thought Content: Thought content normal.        Judgment: Judgment normal.     Results for orders placed or performed in visit on 03/03/22  Influenza A & B (STAT)  Result Value Ref Range   Influenza A Negative Negative   Influenza B Negative Negative      Assessment & Plan:   Problem List Items Addressed This Visit       Other   MDD (major depressive disorder) - Primary    Not under good control. Will add in effexor again and recheck 1 month. Call with any concerns.       Relevant Medications   venlafaxine XR (EFFEXOR XR) 75 MG 24 hr capsule   hydrOXYzine (ATARAX) 10 MG tablet   GAD (generalized anxiety disorder)    Not under good control. Will add in effexor again and recheck 1 month. Call with any concerns.       Relevant Medications   venlafaxine XR (EFFEXOR XR) 75 MG 24 hr capsule   hydrOXYzine (ATARAX) 10 MG tablet   Other Visit Diagnoses     Acute pain of right knee       Not doing well with naproxen and light duty. Will get her into ortho. Call with any concerns.    Relevant Orders   Ambulatory referral to Orthopedic Surgery        Follow up plan: Return in about 4 weeks (around 05/09/2022).

## 2022-04-23 ENCOUNTER — Encounter: Payer: Self-pay | Admitting: Family Medicine

## 2022-09-06 ENCOUNTER — Ambulatory Visit
Admission: EM | Admit: 2022-09-06 | Discharge: 2022-09-06 | Disposition: A | Discharge status: Home / Self Care | Payer: Commercial Managed Care - PPO | Attending: Family Medicine | Admitting: Family Medicine

## 2022-09-06 DIAGNOSIS — J0100 Acute maxillary sinusitis, unspecified: Secondary | ICD-10-CM

## 2022-09-06 MED ORDER — DOXYCYCLINE HYCLATE 100 MG PO CAPS: 100.0000 mg | ORAL_CAPSULE | Freq: Two times a day (BID) | ORAL | 0 refills | Status: DC

## 2022-09-06 NOTE — ED Triage Notes (Signed)
Pt reports she has a sinus infection. She has some aching teeth, headache, and bilateral ear pain x 2 weeks. Took mucinex and xyzal which gave some relief.
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
## 2022-10-23 ENCOUNTER — Ambulatory Visit (INDEPENDENT_AMBULATORY_CARE_PROVIDER_SITE_OTHER): Payer: Commercial Managed Care - PPO | Admitting: Nurse Practitioner

## 2022-10-23 ENCOUNTER — Ambulatory Visit
Admission: RE | Admit: 2022-10-23 | Discharge: 2022-10-23 | Disposition: A | Discharge status: Home / Self Care | Payer: Commercial Managed Care - PPO | Source: Ambulatory Visit | Attending: Nurse Practitioner | Admitting: Nurse Practitioner

## 2022-10-23 ENCOUNTER — Ambulatory Visit
Admission: RE | Admit: 2022-10-23 | Discharge: 2022-10-23 | Disposition: A | Discharge status: Home / Self Care | Payer: Commercial Managed Care - PPO | Attending: Nurse Practitioner | Admitting: Nurse Practitioner

## 2022-10-23 ENCOUNTER — Encounter: Payer: Self-pay | Admitting: Nurse Practitioner

## 2022-10-23 VITALS — BP 113/78 | HR 67 | Temp 98.2°F | Ht 68.5 in | Wt 135.2 lb

## 2022-10-23 DIAGNOSIS — M2550 Pain in unspecified joint: Secondary | ICD-10-CM | POA: Insufficient documentation

## 2022-10-23 DIAGNOSIS — M25561 Pain in right knee: Secondary | ICD-10-CM | POA: Diagnosis not present

## 2022-10-23 MED ORDER — MELOXICAM 15 MG PO TABS: 15.0000 mg | ORAL_TABLET | Freq: Every day | ORAL | 5 refills | Status: DC

## 2022-10-23 MED ORDER — DULOXETINE HCL 30 MG PO CPEP: ORAL_CAPSULE | ORAL | 4 refills | Status: DC

## 2022-10-23 NOTE — Assessment & Plan Note (Signed)
Overall ongoing for some time with worsening over past months.  Will obtain imaging of right knee, this is the most painful area.  Obtain labs: ANA, Rf, CMP, TSH, B12, Vit D.  Recommend we stop Effexor, which she only takes for certain months, and start Duloxetine 30 MG daily for one week and then increase to 60 MG daily -- if fibromyalgia this may offer benefit to both joint pain and mood.  Educated her on this.  Start Meloxicam as needed.  Return in 4 weeks.

## 2022-10-23 NOTE — Patient Instructions (Signed)
Joint Pain  Joint pain can be caused by many things. It is likely to go away if you follow instructions from your doctor for taking care of yourself at home. Sometimes, you may need more treatment. Follow these instructions at home: Managing pain, stiffness, and swelling     If told, put ice on the painful area. To do this: If you have a removable elastic bandage, sling, or splint, take it off as told by your doctor. Put ice in a plastic bag. Place a towel between your skin and the bag. Leave the ice on for 20 minutes, 2-3 times a day. Take off the ice if your skin turns bright red. This is very important. If you cannot feel pain, heat, or cold, you have a greater risk of damage to the area. Move your fingers or toes below the painful joint often. Raise the painful joint above the level of your heart while you are sitting or lying down. If told, put heat on the painful area. Do this as often as told by your doctor. Use the heat source that your doctor recommends, such as a moist heat pack or a heating pad. Place a towel between your skin and the heat source. Leave the heat on for 20-30 minutes. Take off the heat if your skin gets bright red. This is especially important if you are unable to feel pain, heat, or cold. You may have a greater risk of getting burned. Activity Rest the painful joint for as long as told by your doctor. Do not do things that cause pain or make your pain worse. Begin exercising or stretching the affected area, as told by your doctor. Ask your doctor what types of exercise are safe for you. Return to your normal activities when your doctor says that it is safe. If you have an elastic bandage, sling, or splint: Wear it as told by your doctor. Take it only as told by your doctor. Loosen it your fingers or toes below the joint: Tingle. Become numb. Get cold and blue. Keep it clean. Ask your doctor if you should take it off before bathing. If it is not  waterproof: Do not let it get wet. Cover it with a watertight covering when you take a bath or shower. General instructions Take over-the-counter and prescription medicines only as told by your doctor. This may include medicines taken by mouth or applied to the skin. Do not smoke or use any products that contain nicotine or tobacco. If you need help quitting, ask your doctor. Keep all follow-up visits as told by your doctor. This is important. Contact a doctor if: You have pain that gets worse and does not get better with medicine. Your joint pain does not get better in 3 days. You have more bruising or swelling. You have a fever. You lose 10 lb (4.5 kg) or more without trying. Get help right away if: You cannot move the joint. Your fingers or toes tingle, become numb. or get cold and blue. You have a fever along with a joint that is red, warm, and swollen. Summary Joint pain can be caused by many things. It often goes away if you follow instructions from your doctor for taking care of yourself at home. Rest the painful joint for as long as told. Do not do things that cause pain or make your pain worse. Take over-the-counter and prescription medicines only as told by your doctor. This information is not intended to replace advice given to you   by your health care provider. Make sure you discuss any questions you have with your health care provider. Document Revised: 11/09/2019 Document Reviewed: 11/09/2019 Elsevier Patient Education  2023 [employer]  

## 2022-10-23 NOTE — Progress Notes (Signed)
Contacted via MyChart   Good evening, your knee imaging returned with no abnormal findings, but this does not mean no issues with tendons -- this imaging focused more on bone and how much cushion you have.  We will see what labs show [place]:)

## 2022-10-23 NOTE — Progress Notes (Signed)
BP 113/78   Pulse 67   Temp 98.2 F (36.8 C) (Oral)   Ht 5' 8.5" (1.74 m)   Wt 135 lb 3.2 oz (61.3 kg)   LMP 01/26/2017 (Exact Date)   SpO2 99%   BMI 20.26 kg/m    Subjective:    Patient ID: Courtney Rivers, female    DOB: 06/08/1986, 37 y.o.   MRN: [id number]  HPI: Courtney Rivers is a 37 y.o. female  Chief Complaint  Patient presents with   Knee Pain    Right knee pain, has gotten worse over the last 3 months   Hand Pain    Left pointer finger pain and left thumb, started about a month ago   ARTHRALGIAS / JOINT ACHES Is having all over joint pain -- this started over the past week.  Specific areas are more intense. Her mom and three aunts have fibromyalgia -- they take medications for this.    Pain is worse to knee -- has been going on for a long time, but has gotten worse over past 3 months.  Both hands having pain for about one month.  No sensitivity to touch. Duration: months Pain: yes Symmetric: yes  8/10 currently Quality: sharp, dull, aching, and throbbing -- nagging pain that does not go away Frequency: constant Context:  worse Decreased function/range of motion: no Erythema: no Swelling: no Heat or warmth: right knee sometimes Morning stiffness: yes for about 15 minutes Aggravating factors: nothing Alleviating factors: nothing Relief with NSAIDs?: moderate Treatments attempted:  heat, ice, stretches, Ibuprofen (helps some), Tylenol, Voltaren gel (does not work) Involved Joints:     Hands: yes bilateral    Wrists: yes bilateral R>L -- uses weight on right side most at work, right is dominant side    Elbows: none    Shoulders: yes bilateral    Back: yes     Hips: none    Knees: yes bilateral R>L -- right had hurt since 2021 -- no past injuries    Ankles: yes bilateral    Feet: toes hurt  Relevant past medical, surgical, family and social history reviewed and updated as indicated. Interim medical history since our last visit  reviewed. Allergies and medications reviewed and updated.  Review of Systems  Constitutional:  Negative for activity change, appetite change, diaphoresis, fatigue and fever.  Respiratory:  Negative for cough, chest tightness and shortness of breath.   Cardiovascular:  Negative for chest pain, palpitations and leg swelling.  Gastrointestinal: Negative.   Endocrine: Negative for cold intolerance, heat intolerance, polydipsia, polyphagia and polyuria.  Musculoskeletal:  Positive for arthralgias.  Neurological:  Negative for dizziness, syncope, weakness, light-headedness, numbness and headaches.  Psychiatric/Behavioral: Negative.     Per HPI unless specifically indicated above     Objective:    BP 113/78   Pulse 67   Temp 98.2 F (36.8 C) (Oral)   Ht 5' 8.5" (1.74 m)   Wt 135 lb 3.2 oz (61.3 kg)   LMP 01/26/2017 (Exact Date)   SpO2 99%   BMI 20.26 kg/m   Wt Readings from Last 3 Encounters:  10/23/22 135 lb 3.2 oz (61.3 kg)  04/11/22 132 lb 14.4 oz (60.3 kg)  04/01/22 130 lb 4.8 oz (59.1 kg)    Physical Exam Vitals and nursing note reviewed.  Constitutional:      General: She is awake. She is not in acute distress.    Appearance: She is well-developed and well-groomed. She is not ill-appearing or toxic-appearing.  HENT:     Head: Normocephalic.     Right Ear: Hearing and external ear normal.     Left Ear: Hearing and external ear normal.  Eyes:     General: Lids are normal.        Right eye: No discharge.        Left eye: No discharge.     Conjunctiva/sclera: Conjunctivae normal.     Pupils: Pupils are equal, round, and reactive to light.  Neck:     Thyroid: No thyromegaly.     Vascular: No carotid bruit.  Cardiovascular:     Rate and Rhythm: Normal rate and regular rhythm.     Heart sounds: Normal heart sounds. No murmur heard.    No gallop.  Pulmonary:     Effort: Pulmonary effort is normal. No accessory muscle usage or respiratory distress.     Breath sounds:  Normal breath sounds.  Abdominal:     General: Bowel sounds are normal.     Palpations: Abdomen is soft. There is no hepatomegaly or splenomegaly.  Musculoskeletal:     Right hand: No swelling, tenderness or bony tenderness. Normal range of motion. Normal strength. Normal pulse.     Left hand: Tenderness present. No swelling or bony tenderness. Normal range of motion. Decreased strength of finger abduction. Normal pulse.     Cervical back: Normal range of motion and neck supple.     Right knee: Crepitus present. No swelling, erythema, lacerations or bony tenderness. Normal range of motion. Normal pulse.     Left knee: Crepitus present. No swelling, lacerations or bony tenderness. Decreased range of motion. Tenderness present over the medial joint line. Normal pulse.     Right lower leg: No edema.     Left lower leg: No edema.  Lymphadenopathy:     Cervical: No cervical adenopathy.  Skin:    General: Skin is warm and dry.  Neurological:     Mental Status: She is alert and oriented to person, place, and time.     Cranial Nerves: Cranial nerves 2-12 are intact.     Motor: Motor function is intact.     Gait: Gait is intact.     Deep Tendon Reflexes: Reflexes are normal and symmetric.     Reflex Scores:      Brachioradialis reflexes are 2+ on the right side and 2+ on the left side.      Patellar reflexes are 2+ on the right side and 2+ on the left side. Psychiatric:        Attention and Perception: Attention normal.        Mood and Affect: Mood normal.        Speech: Speech normal.        Behavior: Behavior normal. Behavior is cooperative.        Thought Content: Thought content normal.     Results for orders placed or performed in visit on 03/03/22  Influenza A & B (STAT)  Result Value Ref Range   Influenza A Negative Negative   Influenza B Negative Negative      Assessment & Plan:   Problem List Items Addressed This Visit       Other   Multiple joint pain - Primary     Overall ongoing for some time with worsening over past months.  Will obtain imaging of right knee, this is the most painful area.  Obtain labs: ANA, Rf, CMP, TSH, B12, Vit D.  Recommend we stop Effexor, which  she only takes for certain months, and start Duloxetine 30 MG daily for one week and then increase to 60 MG daily -- if fibromyalgia this may offer benefit to both joint pain and mood.  Educated her on this.  Start Meloxicam as needed.  Return in 4 weeks.      Relevant Orders   ANA 12 Plus Profile (RDL)   Rheumatoid Factor   TSH   Comp Met (CMET)   Vitamin B12   VITAMIN D 25 Hydroxy (Vit-D Deficiency, Fractures)   DG Knee Complete 4 Views Right     Follow up plan: Return in about 4 weeks (around 11/20/2022) for Joint Pain  -- changed Effexor to Duloxetine and added Meloxicam .

## 2022-10-24 ENCOUNTER — Encounter: Payer: Self-pay | Admitting: Nurse Practitioner

## 2022-10-24 ENCOUNTER — Other Ambulatory Visit: Payer: Self-pay | Admitting: Nurse Practitioner

## 2022-10-24 DIAGNOSIS — E538 Deficiency of other specified B group vitamins: Secondary | ICD-10-CM | POA: Insufficient documentation

## 2022-10-24 DIAGNOSIS — E559 Vitamin D deficiency, unspecified: Secondary | ICD-10-CM | POA: Insufficient documentation

## 2022-10-24 MED ORDER — CHOLECALCIFEROL 1.25 MG (50000 UT) PO TABS: 1.0000 | ORAL_TABLET | ORAL | 4 refills | Status: DC

## 2022-10-24 NOTE — Progress Notes (Signed)
Contacted via MyChart   Good evening Averi, some of your labs have returned and have provided some information to help [place]: -- Kidney function, creatinine and eGFR, remains normal, as is liver function, AST and ALT. Sodium level a little elevated, reduce your salt intake and add more water, we can recheck next visit. - Vitamin D is super low.  I am sending in a weekly supplement for you to start for this.  Some research even points at this benefiting not only bone health, but mental health.  So we will see if this offers benefit to pain and mood for you. - B12 level on lower side. We like above 300 and your level is just barely there.  I recommend starting Vitamin B12 1000 MCG by mouth daily, you can obtain this over the counter and can use no name brand. - Thyroid lab, TSH, is normal.  Rheumatoid factor normal. Waiting on ANA and will alert you when it returns.  Any questions? Keep being the amazing person you are!!  Thank you for allowing me to participate in your care.  I appreciate you. Kindest regards, Lucifer Soja

## 2022-10-27 ENCOUNTER — Telehealth: Payer: Self-pay | Admitting: Family Medicine

## 2022-10-27 NOTE — Telephone Encounter (Signed)
Copied from [place] [id number]. Topic: General - Inquiry >> Oct 24, 2022  4:49 PM Rosanne Ashing P wrote: Reason for CRM: pt called regarding her labs and xray results.  Please advise  CB@  [phone]

## 2022-10-27 NOTE — Telephone Encounter (Signed)
Labs reviewed by the patient.

## 2022-10-28 NOTE — Progress Notes (Signed)
Contacted via MyChart   Good evening Courtney Rivers, your ANA did return positive and I am waiting for the remainder of this to return to be able better interpret needs.  However, once all returned I may recommend a referral to rheumatology based on your multiple joint pain and symptoms with this.  Any questions? Keep being stellar!!  Thank you for allowing me to participate in your care.  I appreciate you. Kindest regards, Zarianna Dicarlo

## 2022-10-31 ENCOUNTER — Other Ambulatory Visit: Payer: Self-pay | Admitting: Nurse Practitioner

## 2022-10-31 ENCOUNTER — Telehealth: Payer: Self-pay | Admitting: Family Medicine

## 2022-10-31 DIAGNOSIS — M2550 Pain in unspecified joint: Secondary | ICD-10-CM

## 2022-10-31 LAB — ANA 12 PLUS PROFILE, POSITIVE
Anti-CCP Ab, IgG & IgA (RDL): <20 Units (ref <20)
Anti-Cardiolipin Ab, IgA (RDL): <12 APL U/mL (ref <12)
Anti-Cardiolipin Ab, IgG (RDL): <15 GPL U/mL (ref <15)
Anti-Cardiolipin Ab, IgM (RDL): <13 MPL U/mL (ref <13)
Anti-Centromere Ab (RDL): <1:40 {titer}
Anti-Chromatin Ab, IgG (RDL): <20 Units (ref <20)
Anti-La (SS-B) Ab (RDL): <20 Units (ref <20)
Anti-Ro (SS-A) Ab (RDL): <20 Units (ref <20)
Anti-Scl-70 Ab (RDL): <20 Units (ref <20)
Anti-Sm Ab (RDL): <20 Units (ref <20)
Anti-TPO Ab (RDL): <9 IU/mL (ref <9.0)
Anti-U1 RNP Ab (RDL): <20 Units (ref <20)
Anti-dsDNA Ab by Farr(RDL): <8 IU/mL (ref <8.0)
C3 Complement (RDL): 124 mg/dL (ref 82–167)
C4 Complement (RDL): 28 mg/dL (ref 14–44)
Speckled Pattern: 1:160 {titer} — ABNORMAL HIGH

## 2022-10-31 LAB — VITAMIN D 25 HYDROXY (VIT D DEFICIENCY, FRACTURES): Vit D, 25-Hydroxy: 8.8 ng/mL — ABNORMAL LOW (ref 30.0–100.0)

## 2022-10-31 LAB — RHEUMATOID FACTOR: Rheumatoid fact SerPl-aCnc: <10 IU/mL (ref <14.0)

## 2022-10-31 LAB — COMPREHENSIVE METABOLIC PANEL
ALT: 14 IU/L (ref 0–32)
AST: 20 IU/L (ref 0–40)
Albumin/Globulin Ratio: 1.7 (ref 1.2–2.2)
Albumin: 4.4 g/dL (ref 3.9–4.9)
Alkaline Phosphatase: 54 IU/L (ref 44–121)
BUN/Creatinine Ratio: 9 (ref 9–23)
BUN: 7 mg/dL (ref 6–20)
Bilirubin Total: 0.5 mg/dL (ref 0.0–1.2)
CO2: 22 mmol/L (ref 20–29)
Calcium: 9.6 mg/dL (ref 8.7–10.2)
Chloride: 108 mmol/L — ABNORMAL HIGH (ref 96–106)
Creatinine, Ser: 0.81 mg/dL (ref 0.57–1.00)
Globulin, Total: 2.6 g/dL (ref 1.5–4.5)
Glucose: 76 mg/dL (ref 70–99)
Potassium: 3.9 mmol/L (ref 3.5–5.2)
Sodium: 146 mmol/L — ABNORMAL HIGH (ref 134–144)
Total Protein: 7 g/dL (ref 6.0–8.5)
eGFR: 97 mL/min/{1.73_m2} (ref >59)

## 2022-10-31 LAB — TSH: TSH: 1.35 u[IU]/mL (ref 0.450–4.500)

## 2022-10-31 LAB — ANA 12 PLUS PROFILE (RDL): Anti-Nuclear Ab by IFA (RDL): POSITIVE — AB

## 2022-10-31 LAB — VITAMIN B12: Vitamin B-12: 323 pg/mL (ref 232–1245)

## 2022-10-31 NOTE — Telephone Encounter (Unsigned)
Copied from [place] [phone]. Topic: Referral - Request for Referral >> Oct 31, 2022  8:20 AM [name] wrote: Has patient seen PCP for this complaint? Yes.   *If NO, is insurance requiring patient see PCP for this issue before PCP can refer them? Referral for which specialty: Rheumatology Preferred provider/office:  Reason for referral: Positive ANA, Joint pain

## 2022-11-28 ENCOUNTER — Ambulatory Visit (INDEPENDENT_AMBULATORY_CARE_PROVIDER_SITE_OTHER): Payer: Commercial Managed Care - PPO | Admitting: Family Medicine

## 2022-11-28 ENCOUNTER — Other Ambulatory Visit: Payer: Self-pay | Admitting: Podiatry

## 2022-11-28 ENCOUNTER — Encounter: Payer: Self-pay | Admitting: Family Medicine

## 2022-11-28 ENCOUNTER — Ambulatory Visit (INDEPENDENT_AMBULATORY_CARE_PROVIDER_SITE_OTHER): Payer: Commercial Managed Care - PPO

## 2022-11-28 VITALS — BP 114/80 | HR 87 | Temp 98.3°F | Ht 68.0 in | Wt 137.6 lb

## 2022-11-28 DIAGNOSIS — M7751 Other enthesopathy of right foot: Secondary | ICD-10-CM

## 2022-11-28 DIAGNOSIS — E559 Vitamin D deficiency, unspecified: Secondary | ICD-10-CM

## 2022-11-28 DIAGNOSIS — M2550 Pain in unspecified joint: Secondary | ICD-10-CM

## 2022-11-28 MED ORDER — VITAMIN D (ERGOCALCIFEROL) 1.25 MG (50000 UNIT) PO CAPS: ORAL_CAPSULE | ORAL | 1 refills | Status: DC

## 2022-11-28 NOTE — Assessment & Plan Note (Signed)
?   Due to vitamin D def. Will check for tick disease. Await results. Treat as needed.

## 2022-11-28 NOTE — Assessment & Plan Note (Signed)
Will start her on 50000 units 2x a week and recheck at follow up.

## 2022-11-28 NOTE — Progress Notes (Signed)
BP 114/80   Pulse 87   Temp 98.3 F (36.8 C)   Ht  (1.727 m)   Wt 137 lb 9.6 oz (62.4 kg)   LMP 01/26/2017 (Exact Date)   SpO2 100%   BMI 20.92 kg/m    Subjective:    Patient ID: Courtney Rivers, female    DOB: 05/01/1986, 37 y.o.   MRN: [id number]  HPI: Courtney Rivers is a 37 y.o. female  Chief Complaint  Patient presents with   Joint Swelling    Joint pain for 2 mths now located left thumb, right knee, bilateral feet right and left, and pain all over the body   ARTHRALGIAS / JOINT ACHES Duration: months Pain: yes Symmetric: no Status: moderate Quality: aching, sharp Frequency: intermittent Context:  worse Decreased function/range of motion: yes  Erythema: no Swelling: yes Heat or warmth: no Morning stiffness: yes Relief with NSAIDs?: no Treatments attempted:  rest  Involved Joints:     Hands: yes bilateral    Wrists: no      Elbows: no     Shoulders: no     Back: no     Hips: yes bilateral    Knees: yes bilateral    Ankles: no     Feet: yes bilateral   Relevant past medical, surgical, family and social history reviewed and updated as indicated. Interim medical history since our last visit reviewed. Allergies and medications reviewed and updated.  Review of Systems  Constitutional: Negative.   Respiratory: Negative.    Cardiovascular: Negative.   Gastrointestinal: Negative.   Musculoskeletal:  Positive for arthralgias and myalgias. Negative for back pain, gait problem, joint swelling, neck pain and neck stiffness.  Skin: Negative.   Neurological: Negative.   Psychiatric/Behavioral: Negative.      Per HPI unless specifically indicated above     Objective:    BP 114/80   Pulse 87   Temp 98.3 F (36.8 C)   Ht  (1.727 m)   Wt 137 lb 9.6 oz (62.4 kg)   LMP 01/26/2017 (Exact Date)   SpO2 100%   BMI 20.92 kg/m   Wt Readings from Last 3 Encounters:  11/28/22 137 lb 9.6 oz (62.4 kg)  10/23/22 135 lb 3.2 oz (61.3 kg)   04/11/22 132 lb 14.4 oz (60.3 kg)    Physical Exam Vitals and nursing note reviewed.  Constitutional:      General: She is not in acute distress.    Appearance: Normal appearance. She is normal weight. She is not ill-appearing, toxic-appearing or diaphoretic.  HENT:     Head: Normocephalic and atraumatic.     Right Ear: External ear normal.     Left Ear: External ear normal.     Nose: Nose normal.     Mouth/Throat:     Mouth: Mucous membranes are moist.     Pharynx: Oropharynx is clear.  Eyes:     General: No scleral icterus.       Right eye: No discharge.        Left eye: No discharge.     Extraocular Movements: Extraocular movements intact.     Conjunctiva/sclera: Conjunctivae normal.     Pupils: Pupils are equal, round, and reactive to light.  Cardiovascular:     Rate and Rhythm: Normal rate and regular rhythm.     Pulses: Normal pulses.     Heart sounds: Normal heart sounds. No murmur heard.    No friction rub. No gallop.  Pulmonary:  Effort: Pulmonary effort is normal. No respiratory distress.     Breath sounds: Normal breath sounds. No stridor. No wheezing, rhonchi or rales.  Chest:     Chest wall: No tenderness.  Musculoskeletal:        General: Normal range of motion.     Cervical back: Normal range of motion and neck supple.  Skin:    General: Skin is warm and dry.     Capillary Refill: Capillary refill takes less than 2 seconds.     Coloration: Skin is not jaundiced or pale.     Findings: No bruising, erythema, lesion or rash.  Neurological:     General: No focal deficit present.     Mental Status: She is alert and oriented to person, place, and time. Mental status is at baseline.  Psychiatric:        Mood and Affect: Mood normal.        Behavior: Behavior normal.        Thought Content: Thought content normal.        Judgment: Judgment normal.     Results for orders placed or performed in visit on 10/23/22  ANA 12 Plus Profile (RDL)  Result Value  Ref Range   Anti-Nuclear Ab by IFA (RDL) Positive (A) Negative  Rheumatoid Factor  Result Value Ref Range   Rheumatoid fact SerPl-aCnc <10.0 <14.0 IU/mL  TSH  Result Value Ref Range   TSH 1.350 0.450 - 4.500 uIU/mL  Comp Met (CMET)  Result Value Ref Range   Glucose 76 70 - 99 mg/dL   BUN 7 6 - 20 mg/dL   Creatinine, Ser 4.09 0.57 - 1.00 mg/dL   eGFR 97 >81 XB/JYN/8.29   BUN/Creatinine Ratio 9 9 - 23   Sodium 146 (H) 134 - 144 mmol/L   Potassium 3.9 3.5 - 5.2 mmol/L   Chloride 108 (H) 96 - 106 mmol/L   CO2 22 20 - 29 mmol/L   Calcium 9.6 8.7 - 10.2 mg/dL   Total Protein 7.0 6.0 - 8.5 g/dL   Albumin 4.4 3.9 - 4.9 g/dL   Globulin, Total 2.6 1.5 - 4.5 g/dL   Albumin/Globulin Ratio 1.7 1.2 - 2.2   Bilirubin Total 0.5 0.0 - 1.2 mg/dL   Alkaline Phosphatase 54 44 - 121 IU/L   AST 20 0 - 40 IU/L   ALT 14 0 - 32 IU/L  Vitamin B12  Result Value Ref Range   Vitamin B-12 323 232 - 1,245 pg/mL  VITAMIN D 25 Hydroxy (Vit-D Deficiency, Fractures)  Result Value Ref Range   Vit D, 25-Hydroxy 8.8 (L) 30.0 - 100.0 ng/mL  ANA 12 Plus Profile, Positive  Result Value Ref Range   Speckled Pattern 1:160 (H) <1:40   Note: Comment    Anti-Centromere Ab (RDL) <1:40 <1:40   Anti-dsDNA Ab by Farr(RDL) <8.0 <8.0 IU/mL   Anti-Sm Ab (RDL) <20 <20 Units   Anti-U1 RNP Ab (RDL) <20 <20 Units   Anti-Ro (SS-A) Ab (RDL) <20 <20 Units   Anti-La (SS-B) Ab (RDL) <20 <20 Units   Anti-Scl-70 Ab (RDL) <20 <20 Units   Anti-Cardiolipin Ab, IgG (RDL) <15 <15 GPL U/mL   Anti-Cardiolipin Ab, IgA (RDL) <12 <12 APL U/mL   Anti-Cardiolipin Ab, IgM (RDL) <13 <13 MPL U/mL   C3 Complement (RDL) 124 82 - 167 mg/dL   C4 Complement (RDL) 28 14 - 44 mg/dL   Anti-TPO Ab (RDL) <5.6 <9.0 IU/mL   Anti-Chromatin Ab, IgG (RDL) <20 <20 Units  Anti-CCP Ab, IgG & IgA (RDL) <20 <20 Units   Rheumatoid Factor by Turb RDL CANCELED IU/mL   ANA Plus 12 Interpretation Comment       Assessment & Plan:   Problem List Items Addressed  This Visit       Other   Multiple joint pain - Primary    ? Due to vitamin D def. Will check for tick disease. Await results. Treat as needed.       Relevant Orders   RA Qn+CCP(IgG/A)+SjoSSA+SjoSSB   CK   Uric acid   Ehrlichia antibody panel   CBC with Differential/Platelet   Spotted Fever Group Antibodies   Lyme Disease Serology w/Reflex   Sed Rate (ESR)   DG Hand Complete Left   Vitamin D deficiency    Will start her on 50000 units 2x a week and recheck at follow up.        Follow up plan: Return in about 4 weeks (around 12/26/2022).

## 2022-11-28 NOTE — Progress Notes (Signed)
Xrays of right foot taken today.

## 2022-11-29 LAB — SPOTTED FEVER GROUP ANTIBODIES

## 2022-12-01 LAB — LYME DISEASE SEROLOGY W/REFLEX: Lyme Total Antibody EIA: NEGATIVE

## 2022-12-01 LAB — SPOTTED FEVER GROUP ANTIBODIES

## 2022-12-03 LAB — SPOTTED FEVER GROUP ANTIBODIES

## 2022-12-05 LAB — EHRLICHIA ANTIBODY PANEL
E. Chaffeensis (HME) IgM Titer: NEGATIVE
E.Chaffeensis (HME) IgG: NEGATIVE
HGE IgG Titer: NEGATIVE
HGE IgM Titer: NEGATIVE

## 2022-12-05 LAB — URIC ACID: Uric Acid: 3.6 mg/dL (ref 2.6–6.2)

## 2022-12-05 LAB — CBC WITH DIFFERENTIAL/PLATELET
Basophils Absolute: 0 10*3/uL (ref 0.0–0.2)
Basos: 1 %
EOS (ABSOLUTE): 0.2 10*3/uL (ref 0.0–0.4)
Eos: 3 %
Hematocrit: 38.9 % (ref 34.0–46.6)
Hemoglobin: 12.7 g/dL (ref 11.1–15.9)
Immature Grans (Abs): 0 10*3/uL (ref 0.0–0.1)
Immature Granulocytes: 0 %
Lymphocytes Absolute: 1.9 10*3/uL (ref 0.7–3.1)
Lymphs: 39 %
MCH: 28.1 pg (ref 26.6–33.0)
MCHC: 32.6 g/dL (ref 31.5–35.7)
MCV: 86 fL (ref 79–97)
Monocytes Absolute: 0.4 10*3/uL (ref 0.1–0.9)
Monocytes: 9 %
Neutrophils Absolute: 2.3 10*3/uL (ref 1.4–7.0)
Neutrophils: 48 %
Platelets: 302 10*3/uL (ref 150–450)
RBC: 4.52 x10E6/uL (ref 3.77–5.28)
RDW: 11.7 % (ref 11.7–15.4)
WBC: 4.8 10*3/uL (ref 3.4–10.8)

## 2022-12-05 LAB — RA QN+CCP(IGG/A)+SJOSSA+SJOSSB
Cyclic Citrullin Peptide Ab: 8 U (ref 0–19)
ENA SSA (RO) Ab: <0.2 AI (ref 0.0–0.9)
ENA SSB (LA) Ab: <0.2 AI (ref 0.0–0.9)
Rheumatoid fact SerPl-aCnc: <10 [IU]/mL

## 2022-12-05 LAB — SEDIMENTATION RATE: Sed Rate: 7 mm/h (ref 0–32)

## 2022-12-05 LAB — CK: Total CK: 105 U/L (ref 32–182)

## 2022-12-10 ENCOUNTER — Ambulatory Visit (INDEPENDENT_AMBULATORY_CARE_PROVIDER_SITE_OTHER): Payer: Commercial Managed Care - PPO | Admitting: Podiatry

## 2022-12-10 ENCOUNTER — Telehealth: Payer: Self-pay | Admitting: Urology

## 2022-12-10 ENCOUNTER — Encounter: Payer: Self-pay | Admitting: Podiatry

## 2022-12-10 DIAGNOSIS — M2011 Hallux valgus (acquired), right foot: Secondary | ICD-10-CM | POA: Diagnosis not present

## 2022-12-10 NOTE — Telephone Encounter (Signed)
DOS - 01/09/23  [place] BUNIONECTOMY RIGHT --- [id number]  [employer] EFFECTIVE DATE - 08/11/22  SPOKE WITH DEYA WITH [employer] AND SHE STATED THAT FOR CPT CODE 60454 NO PRIOR AUTH IS REQUIRED.  CALL REF # [id number]

## 2022-12-10 NOTE — Progress Notes (Signed)
Presents today for first metatarsophalangeal joint pain right foot.  States has been aggressively painful over the past few months worsening over the past year has had previous cortisone injections x 2 over the past year  Failed to render her asymptomatic.  States that the pain appears to be getting worse as it develops across the plantar aspect of the foot extending to the mid diaphyseal region.  States it hurts to bend the toe and wear shoe gear.  Has tried different shoe gear currently is wearing a cam walker boot.  Objective: Vital signs stable alert oriented x 3 pulses are palpable.  Hallux abductovalgus deformity is reducible in nature but is not hypermobile at the first tarsometatarsal joint.  She has no crepitation on range of motion but does have pain on palpation of the tibial sesamoid with radiating pain proximally and dorsally.  She has pain on end range of motion consistent with capsulitis.  Radiographs were reviewed which do demonstrate an increase in the first intermetatarsal angle greater than normal value hallux abductus angle is greater than normal value.  There is minimal dorsal spurring on radiograph.  Mineralization appears to be normal.  Assessment: Painful hallux abductovalgus deformity of the right foot.  Plan: Discussed etiology pathology conservative surgical therapies at this point I recommended that she continue to wear her cam walker as long as it makes her feel better.  We are going to sign a consent today for a Austin type bunion repair translocation of the capital osteotomy with screw fixation.  She understands this and is amenable to it.  She understands that she will be in the cam boot for anywhere from 2 to 4 weeks for the first 2 weeks she will be pretty much sedentary.  We did discuss the possible postop complications which may include but are not limited to postop pain bleeding swell infection recurrence need for further surgery overcorrection under correction loss of  digit loss limb loss of life and scarring.

## 2022-12-26 ENCOUNTER — Encounter: Payer: Self-pay | Admitting: Family Medicine

## 2022-12-26 ENCOUNTER — Ambulatory Visit (INDEPENDENT_AMBULATORY_CARE_PROVIDER_SITE_OTHER): Payer: Commercial Managed Care - PPO | Admitting: Family Medicine

## 2022-12-26 VITALS — BP 102/68 | HR 71 | Temp 98.0°F | Wt 137.0 lb

## 2022-12-26 DIAGNOSIS — E538 Deficiency of other specified B group vitamins: Secondary | ICD-10-CM | POA: Diagnosis not present

## 2022-12-26 DIAGNOSIS — M2550 Pain in unspecified joint: Secondary | ICD-10-CM | POA: Diagnosis not present

## 2022-12-26 DIAGNOSIS — E559 Vitamin D deficiency, unspecified: Secondary | ICD-10-CM

## 2022-12-26 DIAGNOSIS — F411 Generalized anxiety disorder: Secondary | ICD-10-CM | POA: Diagnosis not present

## 2022-12-26 MED ORDER — VITAMIN B-12 1000 MCG PO TABS: 1000.0000 ug | ORAL_TABLET | Freq: Every day | ORAL | 1 refills | Status: DC

## 2022-12-26 MED ORDER — HYDROXYZINE HCL 10 MG PO TABS: 10.0000 mg | ORAL_TABLET | Freq: Three times a day (TID) | ORAL | 6 refills | Status: DC

## 2022-12-26 NOTE — Assessment & Plan Note (Signed)
In exacerbation with issues at work. Will monitor and restart effexor if not doing better in the next couple of weeks. Instructions given. Call with any concerns.

## 2022-12-26 NOTE — Assessment & Plan Note (Signed)
Will recheck labs at her physical next time. Continue to monitor.

## 2022-12-26 NOTE — Patient Instructions (Signed)
Take 1 effexor 75mg  every other day for 2 weeks, then increase to 1 pill daily if you need to start- check in with me in 4-6 weeks after starting.

## 2022-12-26 NOTE — Assessment & Plan Note (Signed)
Not significantly better, but has multiple confounding circumstances. Continue to monitor. Call with any concerns.

## 2022-12-26 NOTE — Assessment & Plan Note (Signed)
Will pick up OTC B12. Recheck at her physical in about a month.

## 2022-12-26 NOTE — Progress Notes (Signed)
BP 102/68   Pulse 71   Temp 98 F (36.7 C) (Oral)   Wt 137 lb (62.1 kg)   LMP 01/26/2017 (Exact Date)   SpO2 99%   BMI 20.83 kg/m    Subjective:    Patient ID: ARDEEN NOSKO, female    DOB: [birth year], 37 y.o.   MRN: [id number]  HPI: Courtney Rivers is a 37 y.o. female  Chief Complaint  Patient presents with   Myalgias   Depression   Myalgias not any better. Has been taking her vitamin D. Has not started B12. She has been really anxious, which is making things worse. To have surgery on her foot in a couple of weeks.   DEPRESSION Duration: chronic Mood status: exacerbated Satisfied with current treatment?: no Symptom severity: moderate  Duration of current treatment : chronic Side effects: No Medication compliance: good compliance Psychotherapy/counseling: yes  Previous psychiatric medications: effexor, hydroxyzine, seroquel Depressed mood: yes Anxious mood: yes Anhedonia: no Significant weight loss or gain: no Insomnia: no  Fatigue: yes Feelings of worthlessness or guilt: no Impaired concentration/indecisiveness: no Suicidal ideations: no Hopelessness: no Crying spells: no    12/26/2022    3:43 PM 11/28/2022    3:46 PM 10/23/2022    8:26 AM 04/11/2022    3:26 PM 01/01/2022    8:35 AM  Depression screen PHQ 2/9  Decreased Interest 2 0 0 2 0  Down, Depressed, Hopeless 3 0 0 2 0  PHQ - 2 Score 5 0 0 4 0  Altered sleeping 3 0 3 3 0  Tired, decreased energy 3 0 0 2 0  Change in appetite 2 0 0 2 0  Feeling bad or failure about yourself  2 0 0 1 0  Trouble concentrating 2 0 0 3 0  Moving slowly or fidgety/restless 0 0 0 2 0  Suicidal thoughts 0 0 0 0 0  PHQ-9 Score 17 0 3 17 0  Difficult doing work/chores Very difficult Not difficult at all   Not difficult at all   Has been caring for her mother when she is having issues. She is taking her to appointments 2x a month and staying home with her when she has a flare.   Relevant past medical, surgical,  family and social history reviewed and updated as indicated. Interim medical history since our last visit reviewed. Allergies and medications reviewed and updated.  Review of Systems  Constitutional: Negative.   Respiratory: Negative.    Cardiovascular: Negative.   Musculoskeletal:  Positive for arthralgias, back pain and myalgias. Negative for gait problem, joint swelling, neck pain and neck stiffness.  Neurological: Negative.   Psychiatric/Behavioral: Negative.      Per HPI unless specifically indicated above     Objective:    BP 102/68   Pulse 71   Temp 98 F (36.7 C) (Oral)   Wt 137 lb (62.1 kg)   LMP 01/26/2017 (Exact Date)   SpO2 99%   BMI 20.83 kg/m   Wt Readings from Last 3 Encounters:  12/26/22 137 lb (62.1 kg)  11/28/22 137 lb 9.6 oz (62.4 kg)  10/23/22 135 lb 3.2 oz (61.3 kg)    Physical Exam Vitals and nursing note reviewed.  Constitutional:      General: She is not in acute distress.    Appearance: Normal appearance. She is well-developed.  HENT:     Head: Normocephalic and atraumatic.     Right Ear: Hearing and external ear normal.  Left Ear: Hearing and external ear normal.     Nose: Nose normal.     Mouth/Throat:     Mouth: Mucous membranes are moist.     Pharynx: Oropharynx is clear.  Eyes:     General: Lids are normal. No scleral icterus.       Right eye: No discharge.        Left eye: No discharge.     Conjunctiva/sclera: Conjunctivae normal.  Pulmonary:     Effort: Pulmonary effort is normal. No respiratory distress.  Musculoskeletal:        General: Normal range of motion.  Skin:    Coloration: Skin is not jaundiced or pale.     Findings: No bruising, erythema, lesion or rash.  Neurological:     Mental Status: She is alert. Mental status is at baseline. She is disoriented.  Psychiatric:        Mood and Affect: Mood normal.        Speech: Speech normal.        Behavior: Behavior normal.        Thought Content: Thought content  normal.        Judgment: Judgment normal.     Results for orders placed or performed in visit on 11/28/22  RA Qn+CCP(IgG/A)+SjoSSA+SjoSSB  Result Value Ref Range   Rheumatoid fact SerPl-aCnc <10.0 <14.0 IU/mL   ENA SSA (RO) Ab <0.2 0.0 - 0.9 AI   ENA SSB (LA) Ab <0.2 0.0 - 0.9 AI   Cyclic Citrullin Peptide Ab 8 0 - 19 units  CK  Result Value Ref Range   Total CK 105 32 - 182 U/L  Uric acid  Result Value Ref Range   Uric Acid 3.6 2.6 - 6.2 mg/dL  Ehrlichia antibody panel  Result Value Ref Range   E.Chaffeensis (HME) IgG Negative Neg:<1:64   E. Chaffeensis ([facility]ME) IgM Titer Negative Neg:<1:20   HGE IgG Titer Negative Neg:<1:64   HGE IgM Titer Negative Neg:<1:20   Result Comment: Comment   CBC with Differential/Platelet  Result Value Ref Range   WBC 4.8 3.4 - 10.8 x10E3/uL   RBC 4.52 3.77 - 5.28 x10E6/uL   Hemoglobin 12.7 11.1 - 15.9 g/dL   Hematocrit 40.9 81.1 - 46.6 %   MCV 86 79 - 97 fL   MCH 28.1 26.6 - 33.0 pg   MCHC 32.6 31.5 - 35.7 g/dL   RDW 91.4 78.2 - 95.6 %   Platelets 302 150 - 450 x10E3/uL   Neutrophils 48 Not Estab. %   Lymphs 39 Not Estab. %   Monocytes 9 Not Estab. %   Eos 3 Not Estab. %   Basos 1 Not Estab. %   Neutrophils Absolute 2.3 1.4 - 7.0 x10E3/uL   Lymphocytes Absolute 1.9 0.7 - 3.1 x10E3/uL   Monocytes Absolute 0.4 0.1 - 0.9 x10E3/uL   EOS (ABSOLUTE) 0.2 0.0 - 0.4 x10E3/uL   Basophils Absolute 0.0 0.0 - 0.2 x10E3/uL   Immature Granulocytes 0 Not Estab. %   Immature Grans (Abs) 0.0 0.0 - 0.1 x10E3/uL  Spotted Fever Group Antibodies  Result Value Ref Range   Spotted Fever Group IgG <1:64 Neg:<1:64   Spotted Fever Group IgM <1:64 Neg:<1:64   Result Comment Comment   Lyme Disease Serology w/Reflex  Result Value Ref Range   Lyme Total Antibody EIA Negative Negative  Sed Rate (ESR)  Result Value Ref Range   Sed Rate 7 0 - 32 mm/hr      Assessment & Plan:  Problem List Items Addressed This Visit       Other   GAD (generalized anxiety  disorder)    In exacerbation with issues at work. Will monitor and restart effexor if not doing better in the next couple of weeks. Instructions given. Call with any concerns.       Relevant Medications   hydrOXYzine (ATARAX) 10 MG tablet   Multiple joint pain    Not significantly better, but has multiple confounding circumstances. Continue to monitor. Call with any concerns.       Vitamin D deficiency - Primary    Will recheck labs at her physical next time. Continue to monitor.       B12 deficiency    Will pick up OTC B12. Recheck at her physical in about a month.         Follow up plan: Return in about 4 weeks (around 01/23/2023) for physical.

## 2023-01-07 ENCOUNTER — Other Ambulatory Visit: Payer: Self-pay | Admitting: Podiatry

## 2023-01-07 MED ORDER — ONDANSETRON HCL 4 MG PO TABS: 4.0000 mg | ORAL_TABLET | Freq: Three times a day (TID) | ORAL | 0 refills | Status: DC | PRN

## 2023-01-07 MED ORDER — OXYCODONE-ACETAMINOPHEN 10-325 MG PO TABS: 1.0000 | ORAL_TABLET | Freq: Three times a day (TID) | ORAL | 0 refills | Status: AC | PRN

## 2023-01-07 MED ORDER — CLINDAMYCIN HCL 150 MG PO CAPS
150.0000 mg | ORAL_CAPSULE | Freq: Three times a day (TID) | ORAL | 0 refills | Status: DC
Start: 2023-01-07 — End: 2023-01-21

## 2023-01-09 DIAGNOSIS — G8918 Other acute postprocedural pain: Secondary | ICD-10-CM | POA: Diagnosis not present

## 2023-01-09 DIAGNOSIS — M2011 Hallux valgus (acquired), right foot: Secondary | ICD-10-CM | POA: Diagnosis not present

## 2023-01-09 DIAGNOSIS — M21611 Bunion of right foot: Secondary | ICD-10-CM | POA: Diagnosis not present

## 2023-01-09 HISTORY — PX: BUNIONECTOMY: SHX129

## 2023-01-14 ENCOUNTER — Ambulatory Visit (INDEPENDENT_AMBULATORY_CARE_PROVIDER_SITE_OTHER): Payer: Commercial Managed Care - PPO | Admitting: Podiatry

## 2023-01-14 ENCOUNTER — Ambulatory Visit (INDEPENDENT_AMBULATORY_CARE_PROVIDER_SITE_OTHER): Payer: Commercial Managed Care - PPO

## 2023-01-14 ENCOUNTER — Encounter: Payer: Self-pay | Admitting: Podiatry

## 2023-01-14 DIAGNOSIS — M2011 Hallux valgus (acquired), right foot: Secondary | ICD-10-CM

## 2023-01-14 DIAGNOSIS — Z9889 Other specified postprocedural states: Secondary | ICD-10-CM

## 2023-01-14 NOTE — Progress Notes (Signed)
She presents today for her first postop visit date of surgery 01/09/2023.  She had right foot [place] bunionectomy with screw.  States that is been sort of slow work.  States that she took her pain medication and did not tolerate it well with vomiting.  Denies fever chills nausea vomit muscle aches and pains other than her foot hurting.  Objective: Presents today with her cam boot utilizing crutches.  Once removed demonstrates dressing is intact.  Blood on the bandage.  Radiographs taken today demonstrate what appears to have the head has slid just a little bit but the patient denies any trauma.  There is some peaking of the tibial sesamoid but the toe is rectus.  It does demonstrate some proximal migration on the upper arm of the [facility].  Does appear that it may have moved postoperatively or possibly intraoperatively.  There is minimal edema no erythema cellulitis drainage or odor she has good range of motion of the toe.  Assessment is well-healing surgical toe.  Plan: Redressed today dressed a compressive dressing encouraged patient to continue range of motion exercises which I demonstrated to her.

## 2023-01-15 ENCOUNTER — Encounter: Payer: Self-pay | Admitting: Podiatry

## 2023-01-21 ENCOUNTER — Encounter: Payer: Self-pay | Admitting: Podiatry

## 2023-01-21 ENCOUNTER — Ambulatory Visit (INDEPENDENT_AMBULATORY_CARE_PROVIDER_SITE_OTHER): Payer: Commercial Managed Care - PPO | Admitting: Podiatry

## 2023-01-21 DIAGNOSIS — Z9889 Other specified postprocedural states: Secondary | ICD-10-CM

## 2023-01-21 DIAGNOSIS — M2011 Hallux valgus (acquired), right foot: Secondary | ICD-10-CM

## 2023-01-21 NOTE — Progress Notes (Signed)
She presents today 2-week status post [facility] bunion repair with screw.  States that she is doing quite well and hurts on the bottom she says.  Denies fever chills nausea vomit muscle aches pains.  Objective: Vital signs are stable alert oriented x 3 there is no erythematous mild edema no cellulitis drainage or odor incision site is gone on to heal uneventfully with sutures intact.  She has good range of motion passively and actively of the first metatarsophalangeal joint of the right foot.  Assessment: Well-healing surgical foot.  Plan: Follow-up with me in 1 to 2 weeks dispensed a compression anklet in a Darco shoe remove sutures today.  May get foot wet.

## 2023-01-27 ENCOUNTER — Ambulatory Visit (INDEPENDENT_AMBULATORY_CARE_PROVIDER_SITE_OTHER): Payer: Commercial Managed Care - PPO | Admitting: Family Medicine

## 2023-01-27 ENCOUNTER — Encounter: Payer: Self-pay | Admitting: Family Medicine

## 2023-01-27 VITALS — BP 106/73 | HR 65 | Temp 98.4°F | Ht 68.0 in | Wt 133.8 lb

## 2023-01-27 DIAGNOSIS — F411 Generalized anxiety disorder: Secondary | ICD-10-CM | POA: Diagnosis not present

## 2023-01-27 DIAGNOSIS — J301 Allergic rhinitis due to pollen: Secondary | ICD-10-CM

## 2023-01-27 DIAGNOSIS — E559 Vitamin D deficiency, unspecified: Secondary | ICD-10-CM

## 2023-01-27 DIAGNOSIS — E538 Deficiency of other specified B group vitamins: Secondary | ICD-10-CM | POA: Diagnosis not present

## 2023-01-27 DIAGNOSIS — Z0000 Encounter for general adult medical examination without abnormal findings: Secondary | ICD-10-CM | POA: Diagnosis not present

## 2023-01-27 DIAGNOSIS — F331 Major depressive disorder, recurrent, moderate: Secondary | ICD-10-CM | POA: Diagnosis not present

## 2023-01-27 LAB — URINALYSIS, ROUTINE W REFLEX MICROSCOPIC
Bilirubin, UA: NEGATIVE
Glucose, UA: NEGATIVE
Ketones, UA: NEGATIVE
Leukocytes,UA: NEGATIVE
Nitrite, UA: NEGATIVE
Protein,UA: NEGATIVE
RBC, UA: NEGATIVE
Specific Gravity, UA: 1.025 (ref 1.005–1.030)
Urobilinogen, Ur: 0.2 mg/dL (ref 0.2–1.0)
pH, UA: 6 (ref 5.0–7.5)

## 2023-01-27 MED ORDER — IBUPROFEN 800 MG PO TABS: 800.0000 mg | ORAL_TABLET | Freq: Three times a day (TID) | ORAL | 1 refills | Status: DC | PRN

## 2023-01-27 MED ORDER — HYDROXYZINE HCL 10 MG PO TABS: 10.0000 mg | ORAL_TABLET | Freq: Three times a day (TID) | ORAL | 6 refills | Status: DC

## 2023-01-27 MED ORDER — QUETIAPINE FUMARATE 25 MG PO TABS: 25.0000 mg | ORAL_TABLET | Freq: Every evening | ORAL | 2 refills | Status: DC | PRN

## 2023-01-27 MED ORDER — MONTELUKAST SODIUM 10 MG PO TABS
10.0000 mg | ORAL_TABLET | Freq: Every day | ORAL | 3 refills | Status: DC
Start: 2023-01-27 — End: 2024-01-19

## 2023-01-27 NOTE — Assessment & Plan Note (Signed)
Doing better since she is changing jobs. Continue current regimen. Continue to monitor. Call with any concerns. Refills given.  

## 2023-01-27 NOTE — Assessment & Plan Note (Signed)
Rechecking labs today. Await results. Treat as needed.  °
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
## 2023-01-27 NOTE — Assessment & Plan Note (Signed)
Doing better since she is changing jobs. Continue current regimen. Continue to monitor. Call with any concerns. Refills given.

## 2023-01-27 NOTE — Assessment & Plan Note (Signed)
Under good control on current regimen. Continue current regimen. Continue to monitor. Call with any concerns. Refills given.   

## 2023-01-28 ENCOUNTER — Ambulatory Visit (INDEPENDENT_AMBULATORY_CARE_PROVIDER_SITE_OTHER): Payer: Commercial Managed Care - PPO | Admitting: Podiatry

## 2023-01-28 ENCOUNTER — Encounter: Payer: Self-pay | Admitting: Podiatry

## 2023-01-28 DIAGNOSIS — Z9889 Other specified postprocedural states: Secondary | ICD-10-CM

## 2023-01-28 DIAGNOSIS — M2011 Hallux valgus (acquired), right foot: Secondary | ICD-10-CM

## 2023-01-28 LAB — CBC WITH DIFFERENTIAL/PLATELET
Basophils Absolute: 0 10*3/uL (ref 0.0–0.2)
Basos: 1 %
EOS (ABSOLUTE): 0.1 10*3/uL (ref 0.0–0.4)
Eos: 2 %
Hematocrit: 37 % (ref 34.0–46.6)
Hemoglobin: 11.7 g/dL (ref 11.1–15.9)
Immature Grans (Abs): 0 10*3/uL (ref 0.0–0.1)
Immature Granulocytes: 0 %
Lymphocytes Absolute: 2 10*3/uL (ref 0.7–3.1)
Lymphs: 47 %
MCH: 27.7 pg (ref 26.6–33.0)
MCHC: 31.6 g/dL (ref 31.5–35.7)
MCV: 88 fL (ref 79–97)
Monocytes Absolute: 0.4 10*3/uL (ref 0.1–0.9)
Monocytes: 9 %
Neutrophils Absolute: 1.7 10*3/uL (ref 1.4–7.0)
Neutrophils: 41 %
Platelets: 385 10*3/uL (ref 150–450)
RBC: 4.22 x10E6/uL (ref 3.77–5.28)
RDW: 12.3 % (ref 11.7–15.4)
WBC: 4.1 10*3/uL (ref 3.4–10.8)

## 2023-01-28 LAB — COMPREHENSIVE METABOLIC PANEL
ALT: 14 IU/L (ref 0–32)
AST: 18 IU/L (ref 0–40)
Albumin: 4.4 g/dL (ref 3.9–4.9)
Alkaline Phosphatase: 47 IU/L (ref 44–121)
BUN/Creatinine Ratio: 12 (ref 9–23)
BUN: 10 mg/dL (ref 6–20)
Bilirubin Total: <0.2 mg/dL (ref 0.0–1.2)
CO2: 22 mmol/L (ref 20–29)
Calcium: 9.9 mg/dL (ref 8.7–10.2)
Chloride: 104 mmol/L (ref 96–106)
Creatinine, Ser: 0.84 mg/dL (ref 0.57–1.00)
Globulin, Total: 2.6 g/dL (ref 1.5–4.5)
Glucose: 80 mg/dL (ref 70–99)
Potassium: 3.8 mmol/L (ref 3.5–5.2)
Sodium: 140 mmol/L (ref 134–144)
Total Protein: 7 g/dL (ref 6.0–8.5)
eGFR: 93 mL/min/{1.73_m2} (ref >59)

## 2023-01-28 LAB — LIPID PANEL W/O CHOL/HDL RATIO
Cholesterol, Total: 202 mg/dL — ABNORMAL HIGH (ref 100–199)
HDL: 57 mg/dL (ref >39)
LDL Chol Calc (NIH): 126 mg/dL — ABNORMAL HIGH (ref 0–99)
Triglycerides: 107 mg/dL (ref 0–149)
VLDL Cholesterol Cal: 19 mg/dL (ref 5–40)

## 2023-01-28 LAB — VITAMIN B12: Vitamin B-12: 329 pg/mL (ref 232–1245)

## 2023-01-28 LAB — TSH: TSH: 0.918 u[IU]/mL (ref 0.450–4.500)

## 2023-01-28 LAB — VITAMIN D 25 HYDROXY (VIT D DEFICIENCY, FRACTURES): Vit D, 25-Hydroxy: 50.6 ng/mL (ref 30.0–100.0)

## 2023-01-28 NOTE — Progress Notes (Signed)
She presents today for her postop visit she is 3 weeks out now.  She states that she still has tenderness on the bottom of the first metatarsophalangeal joint but she does have more motion in her toe she says as she moves her toe actively.  Objective: Vital signs are stable alert and oriented x 3 much decrease in edema incision sites: Healed uneventfully there is still some scab present she has some swelling around the first metatarsophalangeal joint with limited range of motion on dorsiflexion and plantarflexion with some tenderness.  Assessment well-healing surgical foot status post 3 weeks.  Plan: I am going to allow her to go back to work next Monday with her cam boot on.  I will follow-up with her 1 day next week for x-rays.  Other than that I will follow-up with her in 2 weeks.

## 2023-02-02 ENCOUNTER — Telehealth: Payer: Self-pay | Admitting: Podiatry

## 2023-02-02 NOTE — Telephone Encounter (Signed)
error 

## 2023-02-04 ENCOUNTER — Encounter: Payer: Commercial Managed Care - PPO | Admitting: Podiatry

## 2023-02-05 ENCOUNTER — Ambulatory Visit (INDEPENDENT_AMBULATORY_CARE_PROVIDER_SITE_OTHER): Payer: Commercial Managed Care - PPO | Admitting: Podiatry

## 2023-02-05 ENCOUNTER — Other Ambulatory Visit (INDEPENDENT_AMBULATORY_CARE_PROVIDER_SITE_OTHER): Payer: Commercial Managed Care - PPO | Admitting: *Deleted

## 2023-02-05 ENCOUNTER — Ambulatory Visit (INDEPENDENT_AMBULATORY_CARE_PROVIDER_SITE_OTHER): Payer: Commercial Managed Care - PPO

## 2023-02-05 DIAGNOSIS — Z9889 Other specified postprocedural states: Secondary | ICD-10-CM

## 2023-02-05 DIAGNOSIS — M2011 Hallux valgus (acquired), right foot: Secondary | ICD-10-CM

## 2023-02-05 NOTE — Progress Notes (Signed)
Please refer to other encounter note for today's visit.

## 2023-02-05 NOTE — Progress Notes (Signed)
Presents for prep for a week postop from having a bunionectomy with osteotomy by Dr. Al Corpus.  Notes minimal to no pain at this time.  She is wearing her cam walker at all times weightbearing.  She is interested in finding out whether she can progress to a postoperative shoe.  Notes that her supplementation of vitamin D has increased her blood levels to around 45, which previously was only 9 so this is a big improvement she states that she is feeling much better overall as well since increasing her vitamin D levels.  On exam there is minimal localized edema to the surgical area.  No open areas.  No keloid formation.  Toe is in rectus position.  Minimal to no pain on palpation of the surgical site.  3 weightbearing radiographs were obtained of the right foot today including DP, medial oblique, and lateral views.  These reveal good signs of healing across the osteotomy site.  The screw is in good position and shows no signs of backing out.  There is minimal lateral angulation of the distal capital fragment but otherwise in good position.  No bone callus formation is seen.  On lateral view can still see the dorsal wing of the osteotomy cuts well.  Discussed findings with patient today.  Recommended that she continue with the cam walker for at least 1 more week to ensure bone healing especially since her vitamin D and calcium levels have been fairly low prior to surgery.  In 1 week she most likely could resume wearing a postoperative shoe instead of the cam walker, but recommend another follow-up x-ray at that time.  She was amenable to this.

## 2023-02-11 ENCOUNTER — Other Ambulatory Visit: Payer: Self-pay | Admitting: Podiatry

## 2023-02-11 ENCOUNTER — Ambulatory Visit: Payer: Commercial Managed Care - PPO

## 2023-02-11 ENCOUNTER — Ambulatory Visit (INDEPENDENT_AMBULATORY_CARE_PROVIDER_SITE_OTHER): Payer: Commercial Managed Care - PPO | Admitting: Podiatry

## 2023-02-11 ENCOUNTER — Ambulatory Visit (INDEPENDENT_AMBULATORY_CARE_PROVIDER_SITE_OTHER): Payer: Commercial Managed Care - PPO

## 2023-02-11 DIAGNOSIS — M2011 Hallux valgus (acquired), right foot: Secondary | ICD-10-CM

## 2023-02-11 DIAGNOSIS — Z9889 Other specified postprocedural states: Secondary | ICD-10-CM

## 2023-02-11 NOTE — Progress Notes (Signed)
She presents today for a follow-up of her bunion repair.  She states that has been a little swollen because she has been on it too much.  Objective: Vital signs are stable she is alert and oriented x 3.  There is no erythema cellulitis drainage or odor she does have some moderate edema and scar tissue development.  Radiographs taken today demonstrate an osseously mature foot with moderate dorsal swelling.  She also has limited range of motion of the first metatarsophalangeal joint secondary to scar tissue development.  Assessment: Well-healing surgical foot.  Plan: Allow her to get back into regular shoes and follow-up with her in 2 weeks for x-rays

## 2023-02-19 ENCOUNTER — Encounter: Payer: Commercial Managed Care - PPO | Admitting: Podiatry

## 2023-02-25 ENCOUNTER — Ambulatory Visit (INDEPENDENT_AMBULATORY_CARE_PROVIDER_SITE_OTHER): Payer: Self-pay | Admitting: Podiatry

## 2023-02-25 ENCOUNTER — Encounter: Payer: Self-pay | Admitting: Podiatry

## 2023-02-25 ENCOUNTER — Ambulatory Visit (INDEPENDENT_AMBULATORY_CARE_PROVIDER_SITE_OTHER): Payer: Self-pay

## 2023-02-25 DIAGNOSIS — Z9889 Other specified postprocedural states: Secondary | ICD-10-CM

## 2023-02-25 DIAGNOSIS — M2011 Hallux valgus (acquired), right foot: Secondary | ICD-10-CM

## 2023-02-25 NOTE — Progress Notes (Signed)
She presents today for her postop visit date of surgery 01/09/2019 for right foot Eliberto Ivory bunionectomy with screw states that had a little spot that opened up issues for into the distal aspect of the toe she called that in was concerned that she may be getting infection or dehiscence.  Objective: Vital signs are stable alert and oriented x 3.  The foot has decreased considerably in size though she has stopped exercising the toe regularly.  It is stiff and.  But she does not have a lot of tenderness on attempted range of motion.  The dorsal distal aspect of the incision has gone on to heal uneventfully more than likely this was a small little abscess due to stitch.  Radiographs taken today demonstrate osseously mature individual screw fixation capital osteotomy of the first metatarsal phalangeal joint of the right foot.  It appears that the bone is healing well.  Assessment: Well-healing surgical foot.  Tight first metatarsophalangeal joint.  Plan: Encouraged range of motion activity encouraged her to get back into her orthotics and I will follow-up with her in 1 month for another set of x-rays.

## 2023-04-01 ENCOUNTER — Encounter: Payer: Self-pay | Admitting: Podiatry

## 2023-04-01 ENCOUNTER — Ambulatory Visit (INDEPENDENT_AMBULATORY_CARE_PROVIDER_SITE_OTHER): Payer: Self-pay | Admitting: Podiatry

## 2023-04-01 ENCOUNTER — Ambulatory Visit (INDEPENDENT_AMBULATORY_CARE_PROVIDER_SITE_OTHER): Payer: Self-pay

## 2023-04-01 DIAGNOSIS — M2011 Hallux valgus (acquired), right foot: Secondary | ICD-10-CM

## 2023-04-01 DIAGNOSIS — Z9889 Other specified postprocedural states: Secondary | ICD-10-CM

## 2023-04-01 NOTE — Progress Notes (Signed)
Baxter Hire presents today date of surgery 01/09/2019 for right foot [place] bunionectomy with screw fixation.  States that that spot on Opening up but is finally healed up it looks like as she refers to the dorsal aspect of distal incision right foot.  She states that seems to be doing better and she goes to massage therapy and she also has the area worked on there.  She states that seems to be doing fine she likes the way that it looks and is functioning well.  Objective: Vital signs are stable she is alert oriented x 3 pulses are palpable.  Scar is gone on to heal very nicely she does have good range of motion though still limited a bit on plantarflexion but she has no malleus of the hallux.  Radiographs taken today demonstrate internal fixation is in good position the head has not changed from its first postop visit for it looks like it had been tilted from the day of surgery.  But the osteotomy site is healing nicely.  Assessment: Well-healing surgical foot.  Plan: I would like to follow-up with her on an as-needed basis should she have questions or concerns she will notify us immediately.

## 2023-04-07 ENCOUNTER — Encounter: Payer: Self-pay | Admitting: Podiatry

## 2023-04-07 DIAGNOSIS — M2011 Hallux valgus (acquired), right foot: Secondary | ICD-10-CM

## 2023-04-07 DIAGNOSIS — Z9889 Other specified postprocedural states: Secondary | ICD-10-CM

## 2023-05-29 ENCOUNTER — Ambulatory Visit (INDEPENDENT_AMBULATORY_CARE_PROVIDER_SITE_OTHER): Payer: BC Managed Care – PPO | Admitting: Family Medicine

## 2023-05-29 ENCOUNTER — Ambulatory Visit: Payer: BC Managed Care – PPO | Admitting: Family Medicine

## 2023-05-29 DIAGNOSIS — Z23 Encounter for immunization: Secondary | ICD-10-CM

## 2023-05-29 NOTE — Progress Notes (Signed)
Patient too early for her follow up. Will return when due in December.

## 2023-06-15 ENCOUNTER — Ambulatory Visit: Payer: Self-pay | Admitting: Podiatry

## 2023-07-30 ENCOUNTER — Ambulatory Visit: Payer: BC Managed Care – PPO | Admitting: Family Medicine

## 2023-08-13 ENCOUNTER — Ambulatory Visit: Payer: BC Managed Care – PPO | Admitting: Podiatry

## 2023-08-18 ENCOUNTER — Ambulatory Visit (INDEPENDENT_AMBULATORY_CARE_PROVIDER_SITE_OTHER): Payer: BC Managed Care – PPO

## 2023-08-18 ENCOUNTER — Ambulatory Visit (INDEPENDENT_AMBULATORY_CARE_PROVIDER_SITE_OTHER): Payer: BC Managed Care – PPO | Admitting: Podiatry

## 2023-08-18 ENCOUNTER — Ambulatory Visit: Payer: BC Managed Care – PPO | Admitting: Podiatry

## 2023-08-18 DIAGNOSIS — M2041 Other hammer toe(s) (acquired), right foot: Secondary | ICD-10-CM

## 2023-08-18 DIAGNOSIS — M7751 Other enthesopathy of right foot: Secondary | ICD-10-CM

## 2023-08-18 DIAGNOSIS — M2011 Hallux valgus (acquired), right foot: Secondary | ICD-10-CM

## 2023-08-18 MED ORDER — IBUPROFEN 800 MG PO TABS: 800.0000 mg | ORAL_TABLET | Freq: Three times a day (TID) | ORAL | 1 refills | Status: DC | PRN
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
## 2023-08-25 ENCOUNTER — Telehealth: Payer: Self-pay | Admitting: Family Medicine

## 2023-08-25 NOTE — Telephone Encounter (Signed)
 Copied from CRM [id number]. Topic: Appointment Scheduling - Scheduling Inquiry for Clinic >> Aug 24, 2023  5:17 PM Everette C wrote: Reason for CRM: The patient has called to request an OMM appt  Please contact the patient further when possible

## 2023-09-15 ENCOUNTER — Encounter: Payer: Self-pay | Admitting: Family Medicine

## 2023-09-15 ENCOUNTER — Ambulatory Visit: Payer: BC Managed Care – PPO | Admitting: Family Medicine

## 2023-09-15 VITALS — BP 108/76 | HR 87 | Wt 132.4 lb

## 2023-09-15 DIAGNOSIS — M9904 Segmental and somatic dysfunction of sacral region: Secondary | ICD-10-CM | POA: Diagnosis not present

## 2023-09-15 DIAGNOSIS — M545 Low back pain, unspecified: Secondary | ICD-10-CM

## 2023-09-15 DIAGNOSIS — M9903 Segmental and somatic dysfunction of lumbar region: Secondary | ICD-10-CM | POA: Diagnosis not present

## 2023-09-15 DIAGNOSIS — M9908 Segmental and somatic dysfunction of rib cage: Secondary | ICD-10-CM | POA: Diagnosis not present

## 2023-09-15 DIAGNOSIS — M9905 Segmental and somatic dysfunction of pelvic region: Secondary | ICD-10-CM

## 2023-09-15 DIAGNOSIS — M9902 Segmental and somatic dysfunction of thoracic region: Secondary | ICD-10-CM

## 2023-09-15 DIAGNOSIS — M9909 Segmental and somatic dysfunction of abdomen and other regions: Secondary | ICD-10-CM

## 2023-09-15 NOTE — Progress Notes (Signed)
 BP 108/76   Pulse 87   Wt 132 lb 6.4 oz (60.1 kg)   LMP 01/26/2017 (Exact Date)   SpO2 100%   BMI 20.13 kg/m    Subjective:    Patient ID: Courtney Rivers, female    DOB: 1986-02-27, 38 y.o.   MRN: [id number]  HPI: Courtney Rivers is a 38 y.o. female  Chief Complaint  Patient presents with   Back Pain   Courtney Rivers presents today for evaluation and possible treatment with OMT for low back pain. She notes that it has been worse since she's been wearing a surgical shoe again. Pain is aching and sore. Worse with movement and better with rest. Will radiate into her low back and R leg. She is due to start PT tomorrow to help with gait. She denies any other symptoms. She is otherwise feeling well with no other concerns or complaints at this time.   Relevant past medical, surgical, family and social history reviewed and updated as indicated. Interim medical history since our last visit reviewed. Allergies and medications reviewed and updated.  Review of Systems  Constitutional: Negative.   Respiratory: Negative.    Cardiovascular: Negative.   Gastrointestinal: Negative.   Musculoskeletal:  Positive for arthralgias, back pain, gait problem and myalgias. Negative for joint swelling, neck pain and neck stiffness.  Skin: Negative.   Psychiatric/Behavioral: Negative.      Per HPI unless specifically indicated above     Objective:    BP 108/76   Pulse 87   Wt 132 lb 6.4 oz (60.1 kg)   LMP 01/26/2017 (Exact Date)   SpO2 100%   BMI 20.13 kg/m   Wt Readings from Last 3 Encounters:  09/15/23 132 lb 6.4 oz (60.1 kg)  01/27/23 133 lb 12.8 oz (60.7 kg)  12/26/22 137 lb (62.1 kg)    Physical Exam Vitals and nursing note reviewed.  Constitutional:      General: She is not in acute distress.    Appearance: Normal appearance. She is not ill-appearing.  HENT:     Head: Normocephalic and atraumatic.     Right Ear: External ear normal.     Left Ear: External ear normal.      Nose: Nose normal.     Mouth/Throat:     Mouth: Mucous membranes are moist.     Pharynx: Oropharynx is clear.  Eyes:     Extraocular Movements: Extraocular movements intact.     Conjunctiva/sclera: Conjunctivae normal.     Pupils: Pupils are equal, round, and reactive to light.  Neck:     Vascular: No carotid bruit.  Cardiovascular:     Rate and Rhythm: Normal rate.     Pulses: Normal pulses.  Pulmonary:     Effort: Pulmonary effort is normal. No respiratory distress.  Abdominal:     General: Abdomen is flat. There is no distension.     Palpations: Abdomen is soft. There is no mass.     Tenderness: There is no abdominal tenderness. There is no right CVA tenderness, left CVA tenderness, guarding or rebound.     Hernia: No hernia is present.  Musculoskeletal:     Cervical back: No muscular tenderness.  Lymphadenopathy:     Cervical: No cervical adenopathy.  Skin:    General: Skin is warm and dry.     Capillary Refill: Capillary refill takes less than 2 seconds.     Coloration: Skin is not jaundiced or pale.     Findings: No bruising,  erythema, lesion or rash.  Neurological:     General: No focal deficit present.     Mental Status: She is alert. Mental status is at baseline.  Psychiatric:        Mood and Affect: Mood normal.        Behavior: Behavior normal.        Thought Content: Thought content normal.        Judgment: Judgment normal.   Musculoskeletal:  Exam found Decreased ROM, Tissue texture changes, Tenderness to palpation, and Asymmetry of patient's  thorax, ribs, lumbar, pelvis, sacrum, and abdomen Osteopathic Structural Exam:   Thorax: T3-5SRRL  Ribs: RIbs 5-7 locked up on the L, Rib 6-8 locked down on the R  Lumbar: L1-4SRRL  Pelvis: Posterior R innominate, SI joint restricted on the R  Sacrum: R on R torsion with intraosseous strain at S4on R  Abdomen: diaphragm hypertonic on the R  Results for orders placed or performed in visit on 01/27/23  Urinalysis,  Routine w reflex microscopic   Collection Time: 01/27/23  9:12 AM  Result Value Ref Range   Specific Gravity, UA 1.025 1.005 - 1.030   pH, UA 6.0 5.0 - 7.5   Color, UA Yellow Yellow   Appearance Ur Cloudy (A) Clear   Leukocytes,UA Negative Negative   Protein,UA Negative Negative/Trace   Glucose, UA Negative Negative   Ketones, UA Negative Negative   RBC, UA Negative Negative   Bilirubin, UA Negative Negative   Urobilinogen, Ur 0.2 0.2 - 1.0 mg/dL   Nitrite, UA Negative Negative   Microscopic Examination Comment   CBC with Differential/Platelet   Collection Time: 01/27/23  9:20 AM  Result Value Ref Range   WBC 4.1 3.4 - 10.8 x10E3/uL   RBC 4.22 3.77 - 5.28 x10E6/uL   Hemoglobin 11.7 11.1 - 15.9 g/dL   Hematocrit 62.9 65.9 - 46.6 %   MCV 88 79 - 97 fL   MCH 27.7 26.6 - 33.0 pg   MCHC 31.6 31.5 - 35.7 g/dL   RDW 87.6 88.2 - 84.5 %   Platelets 385 150 - 450 x10E3/uL   Neutrophils 41 Not Estab. %   Lymphs 47 Not Estab. %   Monocytes 9 Not Estab. %   Eos 2 Not Estab. %   Basos 1 Not Estab. %   Neutrophils Absolute 1.7 1.4 - 7.0 x10E3/uL   Lymphocytes Absolute 2.0 0.7 - 3.1 x10E3/uL   Monocytes Absolute 0.4 0.1 - 0.9 x10E3/uL   EOS (ABSOLUTE) 0.1 0.0 - 0.4 x10E3/uL   Basophils Absolute 0.0 0.0 - 0.2 x10E3/uL   Immature Granulocytes 0 Not Estab. %   Immature Grans (Abs) 0.0 0.0 - 0.1 x10E3/uL  Comprehensive metabolic panel   Collection Time: 01/27/23  9:20 AM  Result Value Ref Range   Glucose 80 70 - 99 mg/dL   BUN 10 6 - 20 mg/dL   Creatinine, Ser 9.15 0.57 - 1.00 mg/dL   eGFR 93 >40 fO/fpw/8.26   BUN/Creatinine Ratio 12 9 - 23   Sodium 140 134 - 144 mmol/L   Potassium 3.8 3.5 - 5.2 mmol/L   Chloride 104 96 - 106 mmol/L   CO2 22 20 - 29 mmol/L   Calcium  9.9 8.7 - 10.2 mg/dL   Total Protein 7.0 6.0 - 8.5 g/dL   Albumin 4.4 3.9 - 4.9 g/dL   Globulin, Total 2.6 1.5 - 4.5 g/dL   Bilirubin Total <9.7 0.0 - 1.2 mg/dL   Alkaline Phosphatase 47 44 - 121 IU/L  AST 18 0 - 40  IU/L   ALT 14 0 - 32 IU/L  Lipid Panel w/o Chol/HDL Ratio   Collection Time: 01/27/23  9:20 AM  Result Value Ref Range   Cholesterol, Total 202 (H) 100 - 199 mg/dL   Triglycerides 892 0 - 149 mg/dL   HDL 57 >60 mg/dL   VLDL Cholesterol Cal 19 5 - 40 mg/dL   LDL Chol Calc (NIH) 873 (H) 0 - 99 mg/dL  TSH   Collection Time: 01/27/23  9:20 AM  Result Value Ref Range   TSH 0.918 0.450 - 4.500 uIU/mL  VITAMIN D  25 Hydroxy (Vit-D Deficiency, Fractures)   Collection Time: 01/27/23  9:20 AM  Result Value Ref Range   Vit D, 25-Hydroxy 50.6 30.0 - 100.0 ng/mL  Vitamin B12   Collection Time: 01/27/23  9:20 AM  Result Value Ref Range   Vitamin B-12 329 232 - 1,245 pg/mL      Assessment & Plan:   Problem List Items Addressed This Visit   None Visit Diagnoses       Acute bilateral low back pain without sciatica    -  Primary   In acute exacerbation due to the boot. She does have somatic dysfunction that is contributing to her symptoms. Treated w good results as below. Call w concerns     Somatic dysfunction of thoracic region         Somatic dysfunction of lumbar region         Somatic dysfunction of pelvis region         Rib cage region somatic dysfunction         Segmental dysfunction of abdomen         Segmental dysfunction of sacral region           After verbal consent was obtained, patient was treated today with osteopathic manipulative medicine to the regions of the thorax, ribs, lumbar, pelvis, sacrum, and abdomen using the techniques of myofascial release, counterstrain, muscle energy, HVLA, and soft tissue. Areas of compensation relating to her primary pain source also treated. Patient tolerated the procedure well with good objective and good subjective improvement in symptoms. She left the room in good condition. She was advised to stay well hydrated and that she may have some soreness following the procedure. If not improving or worsening, she will call and come in. She will  return for reevaluation  on a PRN basis.  Follow up plan: Return june for physcial.

## 2023-10-02 ENCOUNTER — Ambulatory Visit: Payer: BC Managed Care – PPO

## 2023-10-02 NOTE — Progress Notes (Signed)
Orthotics  Adjustment needed as orthotics are too rigid I will grind out arch help to make more flexible with heat as well Right area of bunionectomy still causing pain and stiffness I will make patient removable morton's extension to use PRN with inflammation flare ups

## 2023-12-30 ENCOUNTER — Encounter: Payer: Self-pay | Admitting: Podiatry

## 2023-12-30 ENCOUNTER — Ambulatory Visit (INDEPENDENT_AMBULATORY_CARE_PROVIDER_SITE_OTHER): Admitting: Podiatry

## 2023-12-30 DIAGNOSIS — S93491A Sprain of other ligament of right ankle, initial encounter: Secondary | ICD-10-CM

## 2023-12-30 NOTE — Progress Notes (Signed)
 She presents today for follow-up of her ankle sprain right.  States this been several months ago now that she fell out of the bed hitting her big toe but when she stood up she twisted her ankle.  She states that she saw Dr. Luster Salters for that and he was more concerned about her toe than her ankle.  She states that she has tried physical therapy to strengthen it she has tried icing and other anti-inflammatory methods.  States that nothing really keeps it from rolling over.  She states that feel like I am going to break my ankle 1 day.  Objective: Vital signs are stable she is alert oriented x 3.  Pulses been palpable.  Surgical foot looks great with well-healed first metatarsal osteotomy and a rectus toe in good position and has a great range of motion.  She has some mild pes planus.  She has strong musculature around the ankle and all of the dorsiflexors plantar flexors inverters and everters.  She also has normal-appearing ligaments on palpation with exception of the anterior talofibular ligament which I cannot palpate.  She is very thin and I all to be able to palpate that.  Radiographs taken do not demonstrate any significant osseous abnormalities today.  Retention of a screw to the first metatarsal head.  No fractures no acute findings.  Assessment lateral ankle instability right.  Plan: Discussed etiology pathology conservative versus surgical therapies at this point I highly recommended strengthening exercises which I demonstrated to her personally.  Recommended oral anti-inflammatory which she has at home ibuprofen  600 mg.  Recommend ice therapy.  I recommended that she go home and start wearing her cam boot while the ankle is still tender.  I would like to follow-up with her in 4 to 6 weeks for reevaluation.  If she has not improved considerably MRI will have to be performed to evaluate anterior talofibular ligament integrity.

## 2024-01-12 ENCOUNTER — Encounter: Payer: Self-pay | Admitting: Family Medicine

## 2024-01-19 ENCOUNTER — Ambulatory Visit: Admitting: Family Medicine

## 2024-01-19 ENCOUNTER — Encounter: Payer: Self-pay | Admitting: Family Medicine

## 2024-01-19 VITALS — BP 103/68 | HR 69 | Ht 68.0 in | Wt 136.0 lb

## 2024-01-19 DIAGNOSIS — E559 Vitamin D deficiency, unspecified: Secondary | ICD-10-CM | POA: Diagnosis not present

## 2024-01-19 DIAGNOSIS — F331 Major depressive disorder, recurrent, moderate: Secondary | ICD-10-CM | POA: Diagnosis not present

## 2024-01-19 DIAGNOSIS — F411 Generalized anxiety disorder: Secondary | ICD-10-CM

## 2024-01-19 DIAGNOSIS — E538 Deficiency of other specified B group vitamins: Secondary | ICD-10-CM | POA: Diagnosis not present

## 2024-01-19 DIAGNOSIS — J301 Allergic rhinitis due to pollen: Secondary | ICD-10-CM

## 2024-01-19 MED ORDER — MUPIROCIN 2 % EX OINT: 1.0000 | TOPICAL_OINTMENT | Freq: Two times a day (BID) | CUTANEOUS | 3 refills | Status: AC

## 2024-01-19 MED ORDER — MONTELUKAST SODIUM 10 MG PO TABS: 10.0000 mg | ORAL_TABLET | Freq: Every day | ORAL | 3 refills | Status: DC

## 2024-01-19 MED ORDER — ONDANSETRON 4 MG PO TBDP: 4.0000 mg | ORAL_TABLET | Freq: Three times a day (TID) | ORAL | 3 refills | Status: AC | PRN

## 2024-01-19 NOTE — Assessment & Plan Note (Signed)
 Will recheck labs. Continue to monitor. Call with any concerns.

## 2024-01-19 NOTE — Assessment & Plan Note (Signed)
 Under good control on current regimen. Continue current regimen. Continue to monitor. Call with any concerns. Refills up to date. Take as needed. Call with any concerns.

## 2024-01-19 NOTE — Progress Notes (Signed)
 BP 103/68 (BP Location: Left Arm, Patient Position: Sitting, Cuff Size: Normal)   Pulse 69   Ht 5\' 8"  (1.727 m)   Wt 136 lb (61.7 kg)   LMP 01/26/2017 (Exact Date)   SpO2 100%   BMI 20.68 kg/m    Subjective:    Patient ID: Courtney Rivers, female    DOB: Jan 28, 1986, 38 y.o.   MRN: [id number]  HPI: Courtney Rivers is a 38 y.o. female  Chief Complaint  Patient presents with   Depression   DEPRESSION Mood status: controlled Satisfied with current treatment?: yes Symptom severity: mild  Duration of current treatment : chronic Side effects: no Medication compliance: excellent compliance Psychotherapy/counseling: yes  Depressed mood: no Anxious mood: no Anhedonia: no Significant weight loss or gain: no Insomnia: no  Fatigue: yes Feelings of worthlessness or guilt: no Impaired concentration/indecisiveness: no Suicidal ideations: no Hopelessness: no Crying spells: no    01/19/2024    3:04 PM 01/27/2023    9:28 AM 12/26/2022    3:43 PM 11/28/2022    3:46 PM 10/23/2022    8:26 AM  Depression screen PHQ 2/9  Decreased Interest 0 1 2 0 0  Down, Depressed, Hopeless 0 1 3 0 0  PHQ - 2 Score 0 2 5 0 0  Altered sleeping 1 0 3 0 3  Tired, decreased energy 1 1 3  0 0  Change in appetite 0 1 2 0 0  Feeling bad or failure about yourself  0 0 2 0 0  Trouble concentrating 0 0 2 0 0  Moving slowly or fidgety/restless 0 0 0 0 0  Suicidal thoughts 0 0 0 0 0  PHQ-9 Score 2 4 17  0 3  Difficult doing work/chores Somewhat difficult  Very difficult Not difficult at all       01/19/2024    3:04 PM 01/27/2023    9:28 AM 12/26/2022    3:44 PM 11/28/2022    3:46 PM  GAD 7 : Generalized Anxiety Score  Nervous, Anxious, on Edge 0 1 3 0  Control/stop worrying 0 0 3 0  Worry too much - different things 0 0 3 0  Trouble relaxing 0 0 3 0  Restless 0 0 2 0  Easily annoyed or irritable 1 1 3  0  Afraid - awful might happen 0 0 0 0  Total GAD 7 Score 1 2 17  0  Anxiety Difficulty  Somewhat difficult  Very difficult Not difficult at all      Relevant past medical, surgical, family and social history reviewed and updated as indicated. Interim medical history since our last visit reviewed. Allergies and medications reviewed and updated.  Review of Systems  Constitutional: Negative.   Respiratory: Negative.    Cardiovascular: Negative.   Musculoskeletal: Negative.   Neurological: Negative.   Psychiatric/Behavioral: Negative.      Per HPI unless specifically indicated above     Objective:     BP 103/68 (BP Location: Left Arm, Patient Position: Sitting, Cuff Size: Normal)   Pulse 69   Ht 5\' 8"  (1.727 m)   Wt 136 lb (61.7 kg)   LMP 01/26/2017 (Exact Date)   SpO2 100%   BMI 20.68 kg/m   Wt Readings from Last 3 Encounters:  01/19/24 136 lb (61.7 kg)  09/15/23 132 lb 6.4 oz (60.1 kg)  01/27/23 133 lb 12.8 oz (60.7 kg)    Physical Exam Vitals and nursing note reviewed.  Constitutional:      General: She  is not in acute distress.    Appearance: Normal appearance. She is not ill-appearing, toxic-appearing or diaphoretic.  HENT:     Head: Normocephalic and atraumatic.     Right Ear: External ear normal.     Left Ear: External ear normal.     Nose: Nose normal.     Mouth/Throat:     Mouth: Mucous membranes are moist.     Pharynx: Oropharynx is clear.  Eyes:     General: No scleral icterus.       Right eye: No discharge.        Left eye: No discharge.     Extraocular Movements: Extraocular movements intact.     Conjunctiva/sclera: Conjunctivae normal.     Pupils: Pupils are equal, round, and reactive to light.  Cardiovascular:     Rate and Rhythm: Normal rate and regular rhythm.     Pulses: Normal pulses.     Heart sounds: Normal heart sounds. No murmur heard.    No friction rub. No gallop.  Pulmonary:     Effort: Pulmonary effort is normal. No respiratory distress.     Breath sounds: Normal breath sounds. No stridor. No wheezing, rhonchi or  rales.  Chest:     Chest wall: No tenderness.  Musculoskeletal:        General: Normal range of motion.     Cervical back: Normal range of motion and neck supple.  Skin:    General: Skin is warm and dry.     Capillary Refill: Capillary refill takes less than 2 seconds.     Coloration: Skin is not jaundiced or pale.     Findings: No bruising, erythema, lesion or rash.  Neurological:     General: No focal deficit present.     Mental Status: She is alert and oriented to person, place, and time. Mental status is at baseline.  Psychiatric:        Mood and Affect: Mood normal.        Behavior: Behavior normal.        Thought Content: Thought content normal.        Judgment: Judgment normal.     Results for orders placed or performed in visit on 01/27/23  Urinalysis, Routine w reflex microscopic   Collection Time: 01/27/23  9:12 AM  Result Value Ref Range   Specific Gravity, UA 1.025 1.005 - 1.030   pH, UA 6.0 5.0 - 7.5   Color, UA Yellow Yellow   Appearance Ur Cloudy (A) Clear   Leukocytes,UA Negative Negative   Protein,UA Negative Negative/Trace   Glucose, UA Negative Negative   Ketones, UA Negative Negative   RBC, UA Negative Negative   Bilirubin, UA Negative Negative   Urobilinogen, Ur 0.2 0.2 - 1.0 mg/dL   Nitrite, UA Negative Negative   Microscopic Examination Comment   CBC with Differential/Platelet   Collection Time: 01/27/23  9:20 AM  Result Value Ref Range   WBC 4.1 3.4 - 10.8 x10E3/uL   RBC 4.22 3.77 - 5.28 x10E6/uL   Hemoglobin 11.7 11.1 - 15.9 g/dL   Hematocrit 16.1 09.6 - 46.6 %   MCV 88 79 - 97 fL   MCH 27.7 26.6 - 33.0 pg   MCHC 31.6 31.5 - 35.7 g/dL   RDW 04.5 40.9 - 81.1 %   Platelets 385 150 - 450 x10E3/uL   Neutrophils 41 Not Estab. %   Lymphs 47 Not Estab. %   Monocytes 9 Not Estab. %   Eos 2  Not Estab. %   Basos 1 Not Estab. %   Neutrophils Absolute 1.7 1.4 - 7.0 x10E3/uL   Lymphocytes Absolute 2.0 0.7 - 3.1 x10E3/uL   Monocytes Absolute 0.4  0.1 - 0.9 x10E3/uL   EOS (ABSOLUTE) 0.1 0.0 - 0.4 x10E3/uL   Basophils Absolute 0.0 0.0 - 0.2 x10E3/uL   Immature Granulocytes 0 Not Estab. %   Immature Grans (Abs) 0.0 0.0 - 0.1 x10E3/uL  Comprehensive metabolic panel   Collection Time: 01/27/23  9:20 AM  Result Value Ref Range   Glucose 80 70 - 99 mg/dL   BUN 10 6 - 20 mg/dL   Creatinine, Ser 9.51 0.57 - 1.00 mg/dL   eGFR 93 >88 CZ/YSA/6.30   BUN/Creatinine Ratio 12 9 - 23   Sodium 140 134 - 144 mmol/L   Potassium 3.8 3.5 - 5.2 mmol/L   Chloride 104 96 - 106 mmol/L   CO2 22 20 - 29 mmol/L   Calcium  9.9 8.7 - 10.2 mg/dL   Total Protein 7.0 6.0 - 8.5 g/dL   Albumin 4.4 3.9 - 4.9 g/dL   Globulin, Total 2.6 1.5 - 4.5 g/dL   Bilirubin Total <1.6 0.0 - 1.2 mg/dL   Alkaline Phosphatase 47 44 - 121 IU/L   AST 18 0 - 40 IU/L   ALT 14 0 - 32 IU/L  Lipid Panel w/o Chol/HDL Ratio   Collection Time: 01/27/23  9:20 AM  Result Value Ref Range   Cholesterol, Total 202 (H) 100 - 199 mg/dL   Triglycerides 010 0 - 149 mg/dL   HDL 57 >93 mg/dL   VLDL Cholesterol Cal 19 5 - 40 mg/dL   LDL Chol Calc (NIH) 235 (H) 0 - 99 mg/dL  TSH   Collection Time: 01/27/23  9:20 AM  Result Value Ref Range   TSH 0.918 0.450 - 4.500 uIU/mL  VITAMIN D  25 Hydroxy (Vit-D Deficiency, Fractures)   Collection Time: 01/27/23  9:20 AM  Result Value Ref Range   Vit D, 25-Hydroxy 50.6 30.0 - 100.0 ng/mL  Vitamin B12   Collection Time: 01/27/23  9:20 AM  Result Value Ref Range   Vitamin B-12 329 232 - 1,245 pg/mL      Assessment & Plan:   Problem List Items Addressed This Visit       Respiratory   Allergic rhinitis   Relevant Medications   montelukast  (SINGULAIR ) 10 MG tablet     Other   MDD (major depressive disorder) - Primary   Under good control on current regimen. Continue current regimen. Continue to monitor. Call with any concerns. Refills up to date. Take as needed. Call with any concerns.         GAD (generalized anxiety disorder)   Under  good control on current regimen. Continue current regimen. Continue to monitor. Call with any concerns. Refills up to date. Take as needed. Call with any concerns.        Vitamin D  deficiency   Will recheck labs. Continue to monitor. Call with any concerns.       Relevant Orders   VITAMIN D  25 Hydroxy (Vit-D Deficiency, Fractures)   B12 deficiency   Will recheck labs. Continue to monitor. Call with any concerns.       Relevant Orders   B12     Follow up plan: Return in about 4 months (around 05/20/2024) for physical.

## 2024-01-20 ENCOUNTER — Encounter: Payer: Self-pay | Admitting: Family Medicine

## 2024-01-20 LAB — VITAMIN B12: Vitamin B-12: 272 pg/mL (ref 232–1245)

## 2024-01-20 LAB — VITAMIN D 25 HYDROXY (VIT D DEFICIENCY, FRACTURES): Vit D, 25-Hydroxy: 19.1 ng/mL — ABNORMAL LOW (ref 30.0–100.0)

## 2024-01-24 ENCOUNTER — Ambulatory Visit: Payer: Self-pay | Admitting: Family Medicine

## 2024-02-10 ENCOUNTER — Ambulatory Visit: Admitting: Podiatry

## 2024-05-20 ENCOUNTER — Encounter: Payer: Self-pay | Admitting: Family Medicine

## 2024-05-20 ENCOUNTER — Ambulatory Visit: Admitting: Family Medicine

## 2024-05-20 VITALS — BP 101/69 | HR 69 | Temp 98.4°F | Resp 14 | Ht 68.0 in | Wt 134.6 lb

## 2024-05-20 DIAGNOSIS — E559 Vitamin D deficiency, unspecified: Secondary | ICD-10-CM | POA: Diagnosis not present

## 2024-05-20 DIAGNOSIS — E538 Deficiency of other specified B group vitamins: Secondary | ICD-10-CM

## 2024-05-20 DIAGNOSIS — F411 Generalized anxiety disorder: Secondary | ICD-10-CM

## 2024-05-20 DIAGNOSIS — Z0000 Encounter for general adult medical examination without abnormal findings: Secondary | ICD-10-CM

## 2024-05-20 DIAGNOSIS — F331 Major depressive disorder, recurrent, moderate: Secondary | ICD-10-CM | POA: Diagnosis not present

## 2024-05-20 DIAGNOSIS — Z23 Encounter for immunization: Secondary | ICD-10-CM | POA: Diagnosis not present

## 2024-05-20 DIAGNOSIS — G43009 Migraine without aura, not intractable, without status migrainosus: Secondary | ICD-10-CM

## 2024-05-20 MED ORDER — HYDROXYZINE HCL 25 MG PO TABS: 25.0000 mg | ORAL_TABLET | Freq: Three times a day (TID) | ORAL | 5 refills | Status: AC

## 2024-05-20 MED ORDER — IBUPROFEN 800 MG PO TABS: 800.0000 mg | ORAL_TABLET | Freq: Three times a day (TID) | ORAL | 1 refills | Status: AC | PRN

## 2024-05-20 MED ORDER — QUETIAPINE FUMARATE 25 MG PO TABS: 25.0000 mg | ORAL_TABLET | Freq: Every evening | ORAL | 1 refills | Status: AC | PRN

## 2024-05-20 MED ORDER — KETOROLAC TROMETHAMINE 60 MG/2ML IM SOLN
60.0000 mg | Freq: Once | INTRAMUSCULAR | Status: AC
  Administered 2024-05-20: 60 mg via INTRAMUSCULAR

## 2024-05-20 NOTE — Assessment & Plan Note (Signed)
 Acting up a bit. Will increase her hydroxyzine  to 25mg  TID PRN. Call with any concerns. Continue to monitor.

## 2024-05-20 NOTE — Assessment & Plan Note (Signed)
 Under good control on current regimen. Continue current regimen. Continue to monitor. Call with any concerns. Refills given. Labs drawn today.
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
## 2024-05-20 NOTE — Assessment & Plan Note (Signed)
 Rechecking labs today. Await results. Treat as needed.

## 2024-05-21 LAB — COMPREHENSIVE METABOLIC PANEL WITH GFR
ALT: 12 IU/L (ref 0–32)
AST: 15 IU/L (ref 0–40)
Albumin: 4.5 g/dL (ref 3.9–4.9)
Alkaline Phosphatase: 60 IU/L (ref 41–116)
BUN/Creatinine Ratio: 9 (ref 9–23)
BUN: 6 mg/dL (ref 6–20)
Bilirubin Total: 0.4 mg/dL (ref 0.0–1.2)
CO2: 24 mmol/L (ref 20–29)
Calcium: 9.7 mg/dL (ref 8.7–10.2)
Chloride: 104 mmol/L (ref 96–106)
Creatinine, Ser: 0.7 mg/dL (ref 0.57–1.00)
Globulin, Total: 3 g/dL (ref 1.5–4.5)
Glucose: 81 mg/dL (ref 70–99)
Potassium: 4.1 mmol/L (ref 3.5–5.2)
Sodium: 139 mmol/L (ref 134–144)
Total Protein: 7.5 g/dL (ref 6.0–8.5)
eGFR: 114 mL/min/1.73 (ref >59)

## 2024-05-21 LAB — CBC WITH DIFFERENTIAL/PLATELET
Basophils Absolute: 0 x10E3/uL (ref 0.0–0.2)
Basos: 1 %
EOS (ABSOLUTE): 0.2 x10E3/uL (ref 0.0–0.4)
Eos: 6 %
Hematocrit: 40 % (ref 34.0–46.6)
Hemoglobin: 12.3 g/dL (ref 11.1–15.9)
Immature Grans (Abs): 0 x10E3/uL (ref 0.0–0.1)
Immature Granulocytes: 0 %
Lymphocytes Absolute: 2.1 x10E3/uL (ref 0.7–3.1)
Lymphs: 48 %
MCH: 27.6 pg (ref 26.6–33.0)
MCHC: 30.8 g/dL — ABNORMAL LOW (ref 31.5–35.7)
MCV: 90 fL (ref 79–97)
Monocytes Absolute: 0.5 x10E3/uL (ref 0.1–0.9)
Monocytes: 11 %
Neutrophils Absolute: 1.5 x10E3/uL (ref 1.4–7.0)
Neutrophils: 34 %
Platelets: 305 x10E3/uL (ref 150–450)
RBC: 4.45 x10E6/uL (ref 3.77–5.28)
RDW: 11.9 % (ref 11.7–15.4)
WBC: 4.3 x10E3/uL (ref 3.4–10.8)

## 2024-05-21 LAB — LIPID PANEL W/O CHOL/HDL RATIO
Cholesterol, Total: 188 mg/dL (ref 100–199)
HDL: 59 mg/dL (ref >39)
LDL Chol Calc (NIH): 117 mg/dL — ABNORMAL HIGH (ref 0–99)
Triglycerides: 66 mg/dL (ref 0–149)
VLDL Cholesterol Cal: 12 mg/dL (ref 5–40)

## 2024-05-21 LAB — VITAMIN D 25 HYDROXY (VIT D DEFICIENCY, FRACTURES): Vit D, 25-Hydroxy: 18.7 ng/mL — ABNORMAL LOW (ref 30.0–100.0)

## 2024-05-21 LAB — TSH: TSH: 0.774 u[IU]/mL (ref 0.450–4.500)

## 2024-05-21 LAB — VITAMIN B12: Vitamin B-12: 366 pg/mL (ref 232–1245)

## 2024-05-27 ENCOUNTER — Ambulatory Visit: Payer: Self-pay | Admitting: Family Medicine

## 2024-05-27 MED ORDER — VITAMIN D (ERGOCALCIFEROL) 1.25 MG (50000 UNIT) PO CAPS: ORAL_CAPSULE | ORAL | 1 refills | Status: AC

## 2024-07-18 ENCOUNTER — Telehealth: Payer: Self-pay | Admitting: Family Medicine

## 2024-07-18 ENCOUNTER — Ambulatory Visit: Admitting: Family Medicine

## 2024-07-18 NOTE — Telephone Encounter (Signed)
 Patient cancelled appointment. If she wants to reschedule please assist if she calls back for further assistance.

## 2024-07-18 NOTE — Telephone Encounter (Signed)
 Copied from CRM [id number]. Topic: Clinical - Refused Triage >> Jul 18, 2024 12:40 PM Willma R wrote: Patient/caller voiced complaints of Migraine. Declined transfer to triage. Patient has an appointment as an office visit today, but it is in person. Wants to change to to virtual. Per symptom would need to connect to NT. Patient declined stated will wait until the office gets back from lunch and call back.

## 2024-07-18 NOTE — Telephone Encounter (Signed)
 If she wants a shot she should come in otherwise we can do virtual

## 2024-08-10 ENCOUNTER — Ambulatory Visit: Payer: Self-pay

## 2024-08-10 ENCOUNTER — Encounter: Payer: Self-pay | Admitting: Family Medicine

## 2024-08-10 NOTE — Telephone Encounter (Signed)
 Patient denies new or worsening symptoms, states this is a chronic issue and she is looking to schedule an OMM appointment with PCP. Transferred back to [facility] to assist with scheduling.   Copied from CRM #[id number]. Topic: Clinical - Red Word Triage >> Aug 10, 2024 11:41 AM Avram MATSU wrote: Red Word that prompted transfer to Nurse Triage: neck and rib pain that's worsen   ----------------------------------------------------------------------- From previous Reason for Contact - Scheduling: Patient/patient representative is calling to schedule an appointment. Refer to attachments for appointment information.

## 2024-08-12 NOTE — Telephone Encounter (Signed)
 I can see her 1/14 at 11AM- that wellness does not need to be 40 minutes.

## 2024-08-24 ENCOUNTER — Encounter: Payer: Self-pay | Admitting: Family Medicine

## 2024-08-24 ENCOUNTER — Ambulatory Visit: Admitting: Family Medicine

## 2024-08-24 VITALS — BP 110/77 | HR 72 | Temp 99.0°F | Resp 16 | Ht 67.99 in | Wt 134.0 lb

## 2024-08-24 DIAGNOSIS — M9903 Segmental and somatic dysfunction of lumbar region: Secondary | ICD-10-CM

## 2024-08-24 DIAGNOSIS — M9904 Segmental and somatic dysfunction of sacral region: Secondary | ICD-10-CM | POA: Diagnosis not present

## 2024-08-24 DIAGNOSIS — M9905 Segmental and somatic dysfunction of pelvic region: Secondary | ICD-10-CM | POA: Diagnosis not present

## 2024-08-24 DIAGNOSIS — M9908 Segmental and somatic dysfunction of rib cage: Secondary | ICD-10-CM | POA: Diagnosis not present

## 2024-08-24 DIAGNOSIS — M542 Cervicalgia: Secondary | ICD-10-CM | POA: Diagnosis not present

## 2024-08-24 DIAGNOSIS — M9901 Segmental and somatic dysfunction of cervical region: Secondary | ICD-10-CM | POA: Diagnosis not present

## 2024-08-24 DIAGNOSIS — M9902 Segmental and somatic dysfunction of thoracic region: Secondary | ICD-10-CM | POA: Diagnosis not present

## 2024-08-24 DIAGNOSIS — M9909 Segmental and somatic dysfunction of abdomen and other regions: Secondary | ICD-10-CM

## 2024-08-24 DIAGNOSIS — M9900 Segmental and somatic dysfunction of head region: Secondary | ICD-10-CM

## 2024-08-24 NOTE — Progress Notes (Signed)
 "  BP 110/77 (BP Location: Left Arm, Patient Position: Sitting, Cuff Size: Normal)   Pulse 72   Temp 99 F (37.2 C) (Oral)   Resp 16   Ht 5' 7.99 (1.727 m)   Wt 134 lb (60.8 kg)   LMP 01/26/2017   SpO2 99%   BMI 20.38 kg/m    Subjective:    Patient ID: Courtney Rivers, female    DOB: 04/18/1986, 39 y.o.   MRN: [id number]  HPI: Courtney Rivers is a 39 y.o. female  Chief Complaint  Patient presents with   Neck Pain   Courtney Rivers presents today for evaluation and possible treatment with OMT for neck pain. She notes that she slept funny on a pillow a couple of weeks ago and then couldn't turn her head. She then slept funny because of her neck and her back started hurting. Pain is tight and aching. Nothing is making it better, moving it makes it worse. It is not radiating. She is otherwise feeling well with no other concerns or complaints at this time.   Relevant past medical, surgical, family and social history reviewed and updated as indicated. Interim medical history since our last visit reviewed. Allergies and medications reviewed and updated.  Review of Systems  Constitutional: Negative.   Respiratory: Negative.    Cardiovascular: Negative.   Musculoskeletal:  Positive for myalgias, neck pain and neck stiffness. Negative for arthralgias, back pain, gait problem and joint swelling.  Skin: Negative.   Neurological: Negative.   Psychiatric/Behavioral: Negative.      Per HPI unless specifically indicated above     Objective:    BP 110/77 (BP Location: Left Arm, Patient Position: Sitting, Cuff Size: Normal)   Pulse 72   Temp 99 F (37.2 C) (Oral)   Resp 16   Ht 5' 7.99 (1.727 m)   Wt 134 lb (60.8 kg)   LMP 01/26/2017   SpO2 99%   BMI 20.38 kg/m   Wt Readings from Last 3 Encounters:  08/24/24 134 lb (60.8 kg)  05/20/24 134 lb 9.6 oz (61.1 kg)  01/19/24 136 lb (61.7 kg)    Physical Exam Vitals and nursing note reviewed.  Constitutional:      General:  She is not in acute distress.    Appearance: Normal appearance. She is not ill-appearing.  HENT:     Head: Normocephalic and atraumatic.     Right Ear: External ear normal.     Left Ear: External ear normal.     Nose: Nose normal.     Mouth/Throat:     Mouth: Mucous membranes are moist.     Pharynx: Oropharynx is clear.  Eyes:     Extraocular Movements: Extraocular movements intact.     Conjunctiva/sclera: Conjunctivae normal.     Pupils: Pupils are equal, round, and reactive to light.  Neck:     Vascular: No carotid bruit.  Cardiovascular:     Rate and Rhythm: Normal rate.     Pulses: Normal pulses.  Pulmonary:     Effort: Pulmonary effort is normal. No respiratory distress.  Abdominal:     General: Abdomen is flat. There is no distension.     Palpations: Abdomen is soft. There is no mass.     Tenderness: There is no abdominal tenderness. There is no right CVA tenderness, left CVA tenderness, guarding or rebound.     Hernia: No hernia is present.  Musculoskeletal:     Cervical back: No muscular tenderness.  Lymphadenopathy:  Cervical: No cervical adenopathy.  Skin:    General: Skin is warm and dry.     Capillary Refill: Capillary refill takes less than 2 seconds.     Coloration: Skin is not jaundiced or pale.     Findings: No bruising, erythema, lesion or rash.  Neurological:     General: No focal deficit present.     Mental Status: She is alert. Mental status is at baseline.  Psychiatric:        Mood and Affect: Mood normal.        Behavior: Behavior normal.        Thought Content: Thought content normal.        Judgment: Judgment normal.   Musculoskeletal:  Exam found Decreased ROM, Tissue texture changes, Tenderness to palpation, and Asymmetry of patient's  head, neck, thorax, ribs, lumbar, pelvis, sacrum, and abdomen Osteopathic Structural Exam:   Head: hypertonic suboccipital muscles, OAESSR  Neck: C4ESRL, C3ESRR  Thorax: T3-5SLRR  Ribs: Ribs 5-7 locked down  on the L  Lumbar: L2-5SRRL  Pelvis: Anterior R innominate  Sacrum: R on R torsion  Abdomen: diaphragm hypertonic on the R  Results for orders placed or performed in visit on 05/20/24  CBC with Differential/Platelet   Collection Time: 05/20/24  1:47 PM  Result Value Ref Range   WBC 4.3 3.4 - 10.8 x10E3/uL   RBC 4.45 3.77 - 5.28 x10E6/uL   Hemoglobin 12.3 11.1 - 15.9 g/dL   Hematocrit 59.9 65.9 - 46.6 %   MCV 90 79 - 97 fL   MCH 27.6 26.6 - 33.0 pg   MCHC 30.8 (L) 31.5 - 35.7 g/dL   RDW 88.0 88.2 - 84.5 %   Platelets 305 150 - 450 x10E3/uL   Neutrophils 34 Not Estab. %   Lymphs 48 Not Estab. %   Monocytes 11 Not Estab. %   Eos 6 Not Estab. %   Basos 1 Not Estab. %   Neutrophils Absolute 1.5 1.4 - 7.0 x10E3/uL   Lymphocytes Absolute 2.1 0.7 - 3.1 x10E3/uL   Monocytes Absolute 0.5 0.1 - 0.9 x10E3/uL   EOS (ABSOLUTE) 0.2 0.0 - 0.4 x10E3/uL   Basophils Absolute 0.0 0.0 - 0.2 x10E3/uL   Immature Granulocytes 0 Not Estab. %   Immature Grans (Abs) 0.0 0.0 - 0.1 x10E3/uL  Comprehensive metabolic panel with GFR   Collection Time: 05/20/24  1:47 PM  Result Value Ref Range   Glucose 81 70 - 99 mg/dL   BUN 6 6 - 20 mg/dL   Creatinine, Ser 9.29 0.57 - 1.00 mg/dL   eGFR 885 >40 fO/fpw/8.26   BUN/Creatinine Ratio 9 9 - 23   Sodium 139 134 - 144 mmol/L   Potassium 4.1 3.5 - 5.2 mmol/L   Chloride 104 96 - 106 mmol/L   CO2 24 20 - 29 mmol/L   Calcium  9.7 8.7 - 10.2 mg/dL   Total Protein 7.5 6.0 - 8.5 g/dL   Albumin 4.5 3.9 - 4.9 g/dL   Globulin, Total 3.0 1.5 - 4.5 g/dL   Bilirubin Total 0.4 0.0 - 1.2 mg/dL   Alkaline Phosphatase 60 41 - 116 IU/L   AST 15 0 - 40 IU/L   ALT 12 0 - 32 IU/L  Lipid Panel w/o Chol/HDL Ratio   Collection Time: 05/20/24  1:47 PM  Result Value Ref Range   Cholesterol, Total 188 100 - 199 mg/dL   Triglycerides 66 0 - 149 mg/dL   HDL 59 >60 mg/dL   VLDL Cholesterol  Cal 12 5 - 40 mg/dL   LDL Chol Calc (NIH) 882 (H) 0 - 99 mg/dL  TSH   Collection Time:  05/20/24  1:47 PM  Result Value Ref Range   TSH 0.774 0.450 - 4.500 uIU/mL  VITAMIN D  25 Hydroxy (Vit-D Deficiency, Fractures)   Collection Time: 05/20/24  1:47 PM  Result Value Ref Range   Vit D, 25-Hydroxy 18.7 (L) 30.0 - 100.0 ng/mL  B12   Collection Time: 05/20/24  1:47 PM  Result Value Ref Range   Vitamin B-12 366 232 - 1,245 pg/mL      Assessment & Plan:   Problem List Items Addressed This Visit   None Visit Diagnoses       Neck pain    -  Primary   She does have somatic dysfunction that is contributing to her symptoms. Treated today with good results. Call with any concerns.     Somatic dysfunction of thoracic region         Somatic dysfunction of lumbar region         Somatic dysfunction of pelvis region         Rib cage region somatic dysfunction         Segmental dysfunction of abdomen         Segmental dysfunction of sacral region         Head region somatic dysfunction         Cervical segment dysfunction          After verbal consent was obtained, patient was treated today with osteopathic manipulative medicine to the regions of the head, neck, thorax, ribs, lumbar, pelvis, sacrum, and abdomen using the techniques of cranial, myofascial release, counterstrain, muscle energy, HVLA, and soft tissue. Areas of compensation relating to her primary pain source also treated. Patient tolerated the procedure well with good objective and good subjective improvement in symptoms. She left the room in good condition. She was advised to stay well hydrated and that she may have some soreness following the procedure. If not improving or worsening, she will call and come in. She will return for reevaluation  on a PRN basis.   Follow up plan: Return if symptoms worsen or fail to improve.      "

## 2024-09-20 ENCOUNTER — Ambulatory Visit: Admitting: Family Medicine

## 2024-09-28 ENCOUNTER — Ambulatory Visit: Admitting: Podiatry

## 2024-11-21 ENCOUNTER — Ambulatory Visit: Admitting: Family Medicine
